# Patient Record
Sex: Male | Born: 1985 | Race: White | Hispanic: No | Marital: Married | Smoking: Never smoker
Health system: Southern US, Community
[De-identification: ages and names within clinical notes are randomized; demographics above are authoritative.]

## PROBLEM LIST (undated history)

## (undated) DIAGNOSIS — K529 Noninfective gastroenteritis and colitis, unspecified: Secondary | ICD-10-CM

## (undated) DIAGNOSIS — K635 Polyp of colon: Secondary | ICD-10-CM

## (undated) DIAGNOSIS — G43909 Migraine, unspecified, not intractable, without status migrainosus: Secondary | ICD-10-CM

## (undated) DIAGNOSIS — L442 Lichen striatus: Secondary | ICD-10-CM

## (undated) DIAGNOSIS — E785 Hyperlipidemia, unspecified: Secondary | ICD-10-CM

## (undated) DIAGNOSIS — A6920 Lyme disease, unspecified: Secondary | ICD-10-CM

## (undated) DIAGNOSIS — M7500 Adhesive capsulitis of unspecified shoulder: Secondary | ICD-10-CM

## (undated) DIAGNOSIS — K589 Irritable bowel syndrome without diarrhea: Secondary | ICD-10-CM

## (undated) DIAGNOSIS — I300 Acute nonspecific idiopathic pericarditis: Secondary | ICD-10-CM

## (undated) HISTORY — PX: HX WISDOM TEETH EXTRACTION: SHX21

## (undated) HISTORY — DX: Migraine, unspecified, not intractable, without status migrainosus: G43.909

## (undated) HISTORY — DX: Lyme disease, unspecified: A69.20

## (undated) HISTORY — PX: CYSTOSCOPY: SUR368

## (undated) HISTORY — PX: COLONOSCOPY: SHX174

## (undated) HISTORY — PX: HX HERNIA REPAIR: SHX51

## (undated) HISTORY — DX: Irritable bowel syndrome, unspecified: K58.9

## (undated) HISTORY — DX: Acute nonspecific idiopathic pericarditis: I30.0

## (undated) HISTORY — PX: SIGMOIDOSCOPY FLEXIBLE: WVUENDOPRO113

## (undated) HISTORY — DX: Adhesive capsulitis of unspecified shoulder: M75.00

## (undated) HISTORY — PX: FLEXIBLE SIGMOIDOSCOPY: SHX5431

## (undated) HISTORY — DX: Lichen striatus: L44.2

## (undated) HISTORY — PX: COLONOSCOPY W/ POLYPECTOMY: SHX1380

## (undated) HISTORY — PX: HERNIA REPAIR: SHX51

## (undated) HISTORY — DX: Noninfective gastroenteritis and colitis, unspecified: K52.9

## (undated) HISTORY — DX: Polyp of colon: K63.5

## (undated) HISTORY — DX: Hyperlipidemia, unspecified: E78.5

---

## 2016-10-04 ENCOUNTER — Other Ambulatory Visit (FREE_STANDING_LABORATORY_FACILITY): Payer: Self-pay | Admitting: PREVENTIVE MEDICINE

## 2016-10-04 ENCOUNTER — Other Ambulatory Visit (INDEPENDENT_AMBULATORY_CARE_PROVIDER_SITE_OTHER): Payer: Self-pay

## 2016-10-04 ENCOUNTER — Ambulatory Visit (INDEPENDENT_AMBULATORY_CARE_PROVIDER_SITE_OTHER): Payer: Self-pay

## 2016-10-04 ENCOUNTER — Other Ambulatory Visit (INDEPENDENT_AMBULATORY_CARE_PROVIDER_SITE_OTHER): Payer: Self-pay | Admitting: PREVENTIVE MEDICINE

## 2016-10-04 ENCOUNTER — Ambulatory Visit (INDEPENDENT_AMBULATORY_CARE_PROVIDER_SITE_OTHER): Payer: Self-pay | Admitting: PREVENTIVE MEDICINE

## 2016-10-04 DIAGNOSIS — Z0189 Encounter for other specified special examinations: Secondary | ICD-10-CM

## 2016-10-04 DIAGNOSIS — Z0000 Encounter for general adult medical examination without abnormal findings: Secondary | ICD-10-CM

## 2016-10-04 LAB — GOLD TOP TUBE

## 2016-10-04 NOTE — Progress Notes (Signed)
Patient here for work-related exam.  See documentation in paper chart.    Cody Conrad 10/04/2016, 13:12

## 2016-10-04 NOTE — Progress Notes (Signed)
Patient here for work-related exam.  See documentation in paper chart.    Cody Pop, DO 10/04/2016, 10:13

## 2016-10-06 ENCOUNTER — Ambulatory Visit (INDEPENDENT_AMBULATORY_CARE_PROVIDER_SITE_OTHER): Payer: Self-pay

## 2016-10-06 DIAGNOSIS — Z0000 Encounter for general adult medical examination without abnormal findings: Secondary | ICD-10-CM

## 2016-10-06 NOTE — Progress Notes (Signed)
Patient here for work-related exam.  See documentation in paper chart.    Revonda Humphrey, RN 10/06/2016, 07:58

## 2016-10-07 LAB — MYCOBACTERIUM TUBERCULOSIS INFECTION DETERMINATION BY QUANTIFERON-TB G
MITOGEN MINUS NIL RESULT: 14.16 IU/mL
NIL RESULT: 0.01 IU/mL
QUANTIFERON, QUALITATIVE: NEGATIVE
TB AG MINUS NIL RESULT: 0 [IU]/mL

## 2016-10-14 ENCOUNTER — Ambulatory Visit
Payer: BC Managed Care – PPO | Attending: Internal Medicine | Admitting: Student in an Organized Health Care Education/Training Program

## 2016-10-14 ENCOUNTER — Encounter (INDEPENDENT_AMBULATORY_CARE_PROVIDER_SITE_OTHER): Payer: Self-pay | Admitting: Student in an Organized Health Care Education/Training Program

## 2016-10-14 VITALS — BP 122/78 | HR 106 | Temp 97.5°F | Ht 69.02 in | Wt 250.2 lb

## 2016-10-14 DIAGNOSIS — K529 Noninfective gastroenteritis and colitis, unspecified: Secondary | ICD-10-CM | POA: Insufficient documentation

## 2016-10-14 DIAGNOSIS — Z6836 Body mass index (BMI) 36.0-36.9, adult: Secondary | ICD-10-CM

## 2016-10-14 DIAGNOSIS — R197 Diarrhea, unspecified: Secondary | ICD-10-CM

## 2016-10-14 DIAGNOSIS — Z23 Encounter for immunization: Secondary | ICD-10-CM | POA: Insufficient documentation

## 2016-10-14 NOTE — H&P (Signed)
[facility]  Operated by [place] Patient History and Physical       Date:   10/14/2016  Name: Cody Conrad  Age: 30 y.o.    Chief Complaint: Establish Care and Diarrhea    History of Present Illness  Patient here to establish care.  Reports 2-3 month history of diarrhea.  Occurs once weekly and lasts for about 1 day.  Described as watery and not bloody.  Does have some abdominal discomfort.  No known triggers and no change in diet or travel history. Drinks purified water.  Endorses doing bland diet which improves but doesn't resolve symptoms.  Imodium also helps.  Does endorse having new pets around.    Patient describes past history of tentative diagnoses of IBS and Ulcerative colitis.  In middle school, he had various stool tests including parasites and upper and lower scopes.  Was initially told he had UC and placed on prednisone.  One year later, had another scope which looked better and was told he had IBS.  His symptoms eventually resolved.  States he does get diarrhea flare ups when dealing with stress, but his current symptoms are worse and longer lasting.  He does have a cousin with Crohn's disease.    Denies rash, fever, chills or other systemic symptoms.    Past Medical History  Past Medical History:   Diagnosis Date    Migraines         PSH:  -hernia repair         Meds:  Takes no medications except occasional imodium    No Known Allergies    Family History  Family Medical History     Problem Relation (Age of Onset)    Alzheimer's/Dementia Paternal Grandmother    Breast Cancer Maternal Grandmother    Congestive Heart Failure Paternal Grandfather    Hepatitis C Father    Parkinsons Disease Paternal Grandfather    Thyroid Disease Mother        Social History  Social History   Substance Use Topics    Smoking status: Never Smoker    Smokeless tobacco: Never Used    Alcohol use Yes      Comment: drink per week      Review of Systems  General: Negative for fevers, chills or weight  loss   Eyes:  Negative for change in vision   HENT: Negative for hearing loss, nasal congestion, epistaxis, sore mouth,  sore throat   Cardiovascular: Negative for chest pain, palpitations, lower extremity edema   Respiratory: Negative for cough, dyspnea, wheezing   GI: Negative for nausea, vomiting, Positive for abdominal pain, diarrhea,    GU: Negative for hematuria, dysuria   Musculoskeletal: Negative for myalgias, arthralgias   Skin: Negative for rashes, lesions   Neurological: Negative for lightheadedness, headaches, dizziness, paresthesias, weakness   Hematological: Negative for easy bruising or bleeding   Psych:  Negative for depression, anxiety       Examination:  BP 122/78   Pulse (!) 106   Temp 36.4 C (97.5 F) (Thermal Scan)    Ht 1.753 m (5' 9.02")   Wt 113.5 kg (250 lb 3.6 oz)   SpO2 98%   BMI 36.93 kg/m2  Physical Exam:  General - awake and alert. No acute distress.  Eyes - no scleral icterus, PERRL  HEENT - moist mucus membranes.  No thyromegaly or thyroid nodules palpated  Respiratory - chest expansion symmetric. Breath sounds clear and equal bilaterally throughout all fields.  No adventitious breath sounds  Cardiac - normal rate and rhythm. S1, S2 present. No murmurs. Normal peripheral perfusion in all extremities. No edema  Abdomen - soft to palpation, non-tender. Bowel sounds present and normal in tone  Extremities - no edema. Extremities intact. No deformity  Skin - warm, dry, and intact  Neurological - appearance, behavior, and speech appropriate. Alert and oriented x 3. No tremors.  Psychological - appears calm during visit      Data reviewed:  I have reviewed recent data/labs.    Assessment and Plan  1. Diarrhea, unspecified type    30 yo male w/PMH of possible UC vs IBS presents to establish care and complaint of chronic diarrhea.    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 Any significant discussion points are noted.    Sabino Gasser, MD 10/14/2016, 11:11

## 2016-10-14 NOTE — Nursing Note (Signed)
Immunization administered     Name Date Dose VIS Date Route    INFLUENZA VACCINE IM 10/14/2016 0.5 mL 07/11/2014 Intramuscular    Site: Left deltoid    Given By: Thornell Mule, [place]    Manufacturer: [employer]    LotUT:[id number]    [place][id number]        [place], [place]  10/14/2016, 10:56

## 2016-11-02 ENCOUNTER — Ambulatory Visit (INDEPENDENT_AMBULATORY_CARE_PROVIDER_SITE_OTHER): Payer: Self-pay | Admitting: Student in an Organized Health Care Education/Training Program

## 2016-11-02 NOTE — Telephone Encounter (Signed)
Regarding: stool sample kit issue and issue with the post office  ----- Message from [place] sent at 11/01/2016  4:12 PM EST -----  Suzanna Obey, MD    Pt called needing to speak with you about an at home stool sample kit you gave them to do after they saw you on 10/14/16. Stated they completed the kit and mailed it back to you, but received the kit back 14 days after it was sent from the post office. Stated they had a note stating postage is due and pt put 10 stamps on before they sent it. Pt is needing to know what you are wanting them to do or if you want them to re-do the kit. Please call them back to advise, thank you.

## 2016-11-03 ENCOUNTER — Ambulatory Visit: Payer: BC Managed Care – PPO | Attending: Nurse Practitioner | Admitting: Nurse Practitioner

## 2016-11-03 ENCOUNTER — Encounter (INDEPENDENT_AMBULATORY_CARE_PROVIDER_SITE_OTHER): Payer: Self-pay | Admitting: Nurse Practitioner

## 2016-11-03 ENCOUNTER — Other Ambulatory Visit (INDEPENDENT_AMBULATORY_CARE_PROVIDER_SITE_OTHER): Payer: Self-pay | Admitting: Student in an Organized Health Care Education/Training Program

## 2016-11-03 ENCOUNTER — Ambulatory Visit (INDEPENDENT_AMBULATORY_CARE_PROVIDER_SITE_OTHER): Payer: BC Managed Care – PPO | Admitting: Rheumatology

## 2016-11-03 DIAGNOSIS — Z6835 Body mass index (BMI) 35.0-35.9, adult: Secondary | ICD-10-CM

## 2016-11-03 DIAGNOSIS — R197 Diarrhea, unspecified: Secondary | ICD-10-CM

## 2016-11-03 DIAGNOSIS — Z7289 Other problems related to lifestyle: Secondary | ICD-10-CM | POA: Insufficient documentation

## 2016-11-03 LAB — COMPREHENSIVE METABOLIC PANEL, NON-FASTING
ALBUMIN: 4.1 g/dL (ref 3.5–5.0)
ALKALINE PHOSPHATASE: 62 U/L (ref <150)
ALT (SGPT): 24 U/L (ref <55)
ANION GAP: 10 mmol/L (ref 4–13)
AST (SGOT): 20 U/L (ref 8–48)
BILIRUBIN TOTAL: 1.1 mg/dL (ref 0.3–1.3)
BUN/CREA RATIO: 14 (ref 6–22)
BUN: 15 mg/dL (ref 8–25)
CALCIUM: 10.1 mg/dL (ref 8.5–10.2)
CHLORIDE: 105 mmol/L (ref 96–111)
CO2 TOTAL: 26 mmol/L (ref 22–32)
CREATININE: 1.04 mg/dL (ref 0.62–1.27)
ESTIMATED GFR: >59 mL/min/1.73mˆ2 (ref >59)
GLUCOSE: 96 mg/dL (ref 65–139)
POTASSIUM: 4.1 mmol/L (ref 3.5–5.1)
POTASSIUM: 4.1 mmol/L (ref 3.5–5.1)
PROTEIN TOTAL: 7.6 g/dL (ref 6.4–8.3)
SODIUM: 141 mmol/L (ref 136–145)

## 2016-11-03 LAB — THYROID STIMULATING HORMONE (SENSITIVE TSH): TSH: 2.145 u[IU]/mL (ref 0.350–5.000)

## 2016-11-03 LAB — CBC
HCT: 47.2 % — ABNORMAL HIGH (ref 36.7–47.0)
MCH: 29.9 pg (ref 27.4–33.0)
MCHC: 35.2 g/dL (ref 32.5–35.8)
MCHC: 35.2 g/dL (ref 32.5–35.8)
MCV: 84.8 fL (ref 78.0–100.0)
MPV: 8.7 fL (ref 7.5–11.5)
PLATELETS: 251 10*3/uL (ref 140–450)
PLATELETS: 251 x10ˆ3/uL (ref 140–450)
RBC: 5.57 x10ˆ6/uL (ref 4.06–5.63)
WBC: 8.3 x10ˆ3/uL (ref 3.5–11.0)

## 2016-11-03 LAB — C-REACTIVE PROTEIN(CRP),INFLAMMATION: CRP INFLAMMATION: <1 mg/L (ref <=8.0)

## 2016-11-03 NOTE — Progress Notes (Signed)
Requesting Physician: Sabino Gasser, MD   CC: Diarrhea  History of Present Illness  Cody Conrad is a 30 y.o. male with PMH of migraines, IBS. NPV to digestive diseases for evaluation of Diarrhea  Life long issues with diarrhea; particularly bad around 2000 in [place]. He describes abdominal pain, frequency and urgency, with occasional blood on what sounds like hemoccult testing.  He had a colonoscopy and was told that he had ulcerative colitis. He was on a year of daily prednisone as well as a 5-ASA - his symptoms then worsened and he underwent a second colonoscopy at that time and was told there was no evidence of IBD and that he actually just has irritable bowel syndrome. At that time he quit seeing his GI provider and managed his diarrhea with diet and occasional Imodium.  Over the last 10 or so years he describes ~4 loose to formed bowel movements a day. At times he would have periods of urgency and increased associated with stress, greasy foods, etc but this would only last for a couple of days.  He recently moved to [place] for post doctorate study (~6 mos ago) and noticed a change in his bowels.   He is now having about 7 or 8 liquid, urgent bowel movements most days- worsening in the last 4 months. At first he attributed it to stress, but feels that this is out of character for him. He will occasionally take an Imodium, which he said does reduce the frequency. He also describes ~3 episodes where he will go 2 days without a bowel movement at all and then he will feel uncomfortable and have a harder stool.   He denies any hematochezia, mucus, steatorhea, nocturnal bowel movements, wt loss. He denies recent ABX use, fever, weight loss, sick contacts. Drinks city water. He does work in a microbiology lab; does not directly handle c diff toxin but has "been around it." Has been eating a bland diet with very minimal help in his diarrhea.  He attempted to complete stool studies ordered by PCP, but had a mix up  with how to submit them and intends to complete them ASAP.      Family history positive for a cousin with crohn's and both maternal GGM and GGF with colon cancer. Family history negative for ulcerative colitis, celiac's, lactose intolerance, IBS.     Past History  No current outpatient prescriptions on file.     No Known Allergies  Past Medical History:   Diagnosis Date    Migraines      Past Surgical History:   Procedure Laterality Date    HX HERNIA REPAIR       Family History  Family Medical History     Problem Relation (Age of Onset)    Alzheimer's/Dementia Paternal Grandmother    Breast Cancer Maternal Grandmother    Colon Cancer Multiple family members    Congestive Heart Failure Paternal Grandfather    Crohn's Disease Other    Hepatitis C Father    Parkinsons Disease Paternal Grandfather    Thyroid Disease Mother        Social History  Social History     Social History    Marital status: Single     Spouse name: N/A    Number of children: N/A    Years of education: N/A     Social History Main Topics    Smoking status: Never Smoker    Smokeless tobacco: Never Used    Alcohol use Yes  Comment: drink per week    Drug use: Not on file    Sexual activity: Not on file     Other Topics Concern    Not on file     Social History Narrative     Review of Systems  General: Denies weight loss, fatigue, or fevers/chills   Eyes: Denies visual disturbances, sensitivity to light, or recent eye infections.  HENT: Denies dysphagia, odynophagia, headaches, or lymphadenopathy    Resp: Denies feelings of shortness of breath. Denies cough and hemoptysis.  Cardiac: Denies chest pain, palpitations, or swelling of extremities  GI: Denies abdominal pain, bloating, heartburn/indigestion, nausea/vomiting, constipation. +diarrhea  Skin: No rashes, jaundice, pruritus, or bruising  Psych: No depression, anxiety, or difficulty coping    Examination  Vitals:   Vitals:    11/03/16 1517   BP: 125/80   Pulse: 67   Temp: 36.6 C  (97.9 F)   TempSrc: Thermal Scan   SpO2: 97%   Weight: 110.3 kg (243 lb 2.7 oz)   Height: 1.774 m (5' 9.84")      General: 30 y.o. well appearing male in no observable distress.    Neuro: Alert, awake, oriented X 3.    Eyes: Sclera non-icteric. Conjunctiva pink.   HENT: Normocephalic, atraumatic   Resp: A/P clear to auscultation. Non-labored on room air.    Cardiac: Regular rate and rhythm. No murmur, gallop, click, or rub. Without edema   GI: Soft, non-distended, non-tender.  Normo-active bowel sounds x4.    Skin: Uniformly pink. Negative for rash, jaundice, or bruising.     Psych: Alert and pleasant.  Stable mood.        Labs    CBC  Diff   No results found for: WBC, WBCJ, HGB, HCT, PLTCNT, SEDRATE, RBC, MCV, MCHC, MCH, RDW, MPV No results found for: PMNS, LYMPHOCYTES, EOSINOPHIL, MONOCYTES, BASOPHILS, PMNABS, LYMPHSABS, EOSABS, MONOSABS, BASOSABS     Comprehensive Metabolic Profile    No results found for: SODIUM, POTASSIUM, CHLORIDE, CO2, ANIONGAP, BUN, CREATININE, GLUCOSE No results found for: CALCIUM, PHOSPHORUS, ALBUMIN, TOTALPROTEIN, ALKPHOS, AST, ALT, TOTBILIRUBIN     Impression  1. Diarrhea, unspecified type      Assessment / Recommendations    Very nice 30 year old male with life long diarrhea that was at one point dx as ulcerative colitis and then told that "a mistake had been made" and it was actually irritable bowel. Has managed his symptoms fairly well with lifestyle, stress reduction, OTC measures until recently when the diarrhea increased in frequency and consistency.     1. Diarrhea   - Differential includes IBS-D, IBD, endocrine imbalance, malabsorption syndromes (lactose intolerance, celiac's, pancreatic insufficiency), infection.  - Questionable remote hx of IBD? ESR and CRP today. Agreeable to schedule colonoscopy for further evaluation. 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   - TSH to rule out endocrine origin of diarrhea  - TTG IgA and  total IgA today for evaluation of celiac's  - CBC, c-diff, giardia, ova/parasites, and stool culture today to rule out infection. He does work in a microbiology setting.   - After infection is ruled out can recommend starting fiber supplement (Metamucil, Benefiber, Citrucel) 1 tbsp every other day for 1 week then increase to daily.   - Advised to consume at least 60 oz of fluid to prevent fiber from causing constipation.  - If daily dosing of fiber is insufficient can increase further to  BID/TID dosing.  - Patient identifies a sensitivity to lactose and typically uses lactose free products at this time. He has never tried a probiotic and we discussed the addition of this could be helpful.   - Advised to go to Emergency Room for any fever or abdominal swelling.       Orders     Orders Placed This Encounter    C Difficile Toxin    Cbc    TISSUE TRANSGLUTAMINASE (TTG) ANTIBODY, IGA, SERUM    Immunoglobulin A    C-Reactive Protein (Inflammation)    Comprehensive Metabolic Panel, Nf    Tsh     This was an independent visit, with cosigning physician present in clinic.       Return to clinic in 6 months.  I encouraged for patient to contact me via phone or [place] for questions between appointments.      Sheilah Mins, APRN, NP-C  Section of Digestive Diseases    The nurse practitioner saw this patient by herself and did not need assistance.

## 2016-11-04 ENCOUNTER — Encounter (INDEPENDENT_AMBULATORY_CARE_PROVIDER_SITE_OTHER): Payer: Self-pay | Admitting: Nurse Practitioner

## 2016-11-04 LAB — IMMUNOGLOBULIN A (IGA), SERUM
IMMUNOGLOBULIN A (IGA): 231 mg/dL (ref 85–499)
IMMUNOGLOBULIN A (IGA): 231 mg/dL (ref 85–499)

## 2016-11-07 ENCOUNTER — Ambulatory Visit: Payer: BC Managed Care – PPO | Attending: Internal Medicine | Admitting: Rheumatology

## 2016-11-07 DIAGNOSIS — R197 Diarrhea, unspecified: Principal | ICD-10-CM | POA: Insufficient documentation

## 2016-11-07 LAB — TISSUE TRANSGLUTAMINASE (TTG) ANTIBODY, IGA, SERUM: TISSUE TRANSGLUTAMINASE ANTIBODIES IGA QUALITATIVE: NEGATIVE

## 2016-11-08 LAB — LACTOFERRIN: LACTOFERRIN: NEGATIVE

## 2016-11-08 LAB — FAT, FECES, QUALITATIVE: TOTAL WEIGHT: 15 g

## 2016-11-08 LAB — OCCULT BLOOD, STOOL: OCCULT BLOOD: NEGATIVE

## 2016-11-08 LAB — E. COLI SHIGA TOXIN: SHIGA TOXIN 2: NEGATIVE

## 2016-11-09 LAB — ROUTINE STOOL CULTURE (INCLUDING E. COLI SHIGA TOXIN): STOOL CULTURE: NORMAL

## 2016-11-10 ENCOUNTER — Encounter (INDEPENDENT_AMBULATORY_CARE_PROVIDER_SITE_OTHER): Payer: Self-pay | Admitting: Nurse Practitioner

## 2016-11-10 LAB — CALPROTECTIN, FECES: CALPROTECTIN, FECES: <15.6 mcg/g

## 2016-12-02 ENCOUNTER — Ambulatory Visit
Admission: RE | Admit: 2016-12-02 | Discharge: 2016-12-02 | Disposition: A | Discharge status: Home / Self Care | Payer: BC Managed Care – PPO | Source: Ambulatory Visit

## 2016-12-02 ENCOUNTER — Encounter (HOSPITAL_COMMUNITY): Payer: Self-pay

## 2016-12-12 ENCOUNTER — Encounter (INDEPENDENT_AMBULATORY_CARE_PROVIDER_SITE_OTHER): Payer: Self-pay | Admitting: Student in an Organized Health Care Education/Training Program

## 2016-12-13 ENCOUNTER — Other Ambulatory Visit (HOSPITAL_COMMUNITY): Payer: Self-pay | Admitting: Gastroenterology

## 2016-12-13 ENCOUNTER — Ambulatory Visit (HOSPITAL_COMMUNITY): Payer: BC Managed Care – PPO | Admitting: ANESTHESIOLOGY

## 2016-12-13 ENCOUNTER — Encounter (HOSPITAL_COMMUNITY): Payer: Self-pay

## 2016-12-13 ENCOUNTER — Inpatient Hospital Stay
Admission: RE | Admit: 2016-12-13 | Discharge: 2016-12-13 | Disposition: A | Discharge status: Home / Self Care | Payer: BC Managed Care – PPO | Source: Ambulatory Visit | Attending: Gastroenterology | Admitting: Gastroenterology

## 2016-12-13 ENCOUNTER — Ambulatory Visit (HOSPITAL_BASED_OUTPATIENT_CLINIC_OR_DEPARTMENT_OTHER): Payer: BC Managed Care – PPO | Admitting: ANESTHESIOLOGY

## 2016-12-13 ENCOUNTER — Encounter (HOSPITAL_COMMUNITY)
Admission: RE | Disposition: A | Discharge status: Home / Self Care | Payer: Self-pay | Source: Ambulatory Visit | Attending: Gastroenterology

## 2016-12-13 ENCOUNTER — Ambulatory Visit (HOSPITAL_BASED_OUTPATIENT_CLINIC_OR_DEPARTMENT_OTHER): Payer: BC Managed Care – PPO | Admitting: Gastroenterology

## 2016-12-13 DIAGNOSIS — R194 Change in bowel habit: Secondary | ICD-10-CM

## 2016-12-13 DIAGNOSIS — D125 Benign neoplasm of sigmoid colon: Secondary | ICD-10-CM

## 2016-12-13 SURGERY — COLONOSCOPY
Anesthesia: Monitor Anesthesia Care | Site: Anus | Wound class: Clean Contaminated Wounds-The respiratory, GI, Genital, or urinary

## 2016-12-13 MED ORDER — LACTATED RINGERS INTRAVENOUS SOLUTION
INTRAVENOUS | Status: DC
Start: 2016-12-13 — End: 2016-12-13

## 2016-12-13 MED ORDER — FENTANYL (PF) 50 MCG/ML INJECTION SOLUTION
Freq: Once | INTRAMUSCULAR | Status: DC | PRN
Start: 2016-12-13 — End: 2016-12-13

## 2016-12-13 MED ORDER — SODIUM CHLORIDE 0.9 % (FLUSH) INJECTION SYRINGE
2.0000 mL | INJECTION | Freq: Three times a day (TID) | INTRAMUSCULAR | Status: DC
Start: 2016-12-13 — End: 2016-12-13

## 2016-12-13 MED ORDER — SODIUM CHLORIDE 0.9 % (FLUSH) INJECTION SYRINGE
2.0000 mL | INJECTION | INTRAMUSCULAR | Status: DC | PRN
Start: 2016-12-13 — End: 2016-12-13

## 2016-12-13 MED ORDER — PROPOFOL 10 MG/ML IV BOLUS
INJECTION | Freq: Once | INTRAVENOUS | Status: DC | PRN
Start: 2016-12-13 — End: 2016-12-13
  Administered 2016-12-13 (×3): 100 mg via INTRAVENOUS
  Administered 2016-12-13: 50 mg via INTRAVENOUS

## 2016-12-13 MED ORDER — PROPOFOL 10 MG/ML INTRAVENOUS EMULSION
INTRAVENOUS | Status: DC | PRN
Start: 2016-12-13 — End: 2016-12-13
  Administered 2016-12-13: 0 ug/kg/min via INTRAVENOUS
  Administered 2016-12-13: 250 ug/kg/min via INTRAVENOUS

## 2016-12-13 MED ORDER — MIDAZOLAM 1 MG/ML INJECTION SOLUTION
Freq: Once | INTRAMUSCULAR | Status: DC | PRN
Start: 2016-12-13 — End: 2016-12-13
  Administered 2016-12-13: 2 mg via INTRAVENOUS

## 2016-12-13 MED ADMIN — ONDANSETRON/ DEXAMETHASONE IVPB: INTRAVENOUS | @ 14:00:00

## 2016-12-13 MED ADMIN — lactated Ringers intravenous solution: INTRAVENOUS | @ 14:00:00

## 2016-12-13 SURGICAL SUPPLY — 70 items
CATH CRE 10-11-12MM 7.5FR 5.5CM 240CM 2.8MM 3.2MM LOW PROF GW BAL DIL ESOPH PYL BIL PEBAX STRL LF (BALLOON) IMPLANT
CATH ELHMST GLD PRB 10FR 300CM_BIPO RND DIST TIP STD CONN (DIAGNOSTIC)
CATH ELHMST GLD PROBE 10FR 300CM BIPOLAR RND DIST TIP STD CONN FIRM SHAFT HMGLD STRL DISP 3.7MM MN (DIAGNOSTIC) IMPLANT
CATH ELHMST GLD PROBE 7FR 300CM BIPOLAR RND DIST TIP STD CONN FIRM SHAFT HMGLD STRL DISP 2.8MM MN (DIAGNOSTIC) IMPLANT
CATH ELHMST GLD PROBE 7FR 300C_M BIPOLAR RND DIST TIP STD (DIAGNOSTIC)
CLIP HMST MR CONDITIONAL BRD CATH ROT CONTROL KNOB NO SHEATH RSL 360 235CM 2.8MM 11MM OPN (SURGICAL INSTRUMENTS) IMPLANT
CONV USE ITEM 343591 - SOLIDIFY FLUID 1500ML DSPNSR L_Q TX SOLIDIFY SFTP LTS+ DISP (STER) ×1 IMPLANT
DEVICE HEMOSTASIS CLIP 360_235CM RESOLUTION (INSTRUMENTS)
DILATOR ENDOS CRE 240CM 5.5CM 11-13.5-15MM 7.5FR ESOPH PYL BIL BAL LOW PROF GW PEBAX STRL LF  DISP (BALLOON) IMPLANT
DILATOR ENDOS CRE 240CM 5.5CM 15-16.5-18MM 7.5FR ESOPH PYL BIL BAL LOW PROF GW PEBAX STRL LF  DISP (BALLOON) IMPLANT
DILATOR ENDOS CRE 240CM 5.5CM_11-13.5-15MM 7.5FR ESOPH PYL (BALLOON)
DILATOR ENDOS CRE 240CM 5.5CM_15-16.5-18MM 7.5FR ESOPH PYL (BALLOON)
DILATOR HERCULES 10-11-12_M00558680 EA (BALLOON)
DISC USE ITEM 309153 - TRAP SPECI ARGYLE 40CC GRAD SC_REW ON CAP REM MALE CONN (Cautery Accessories)
DISCONTINUED NO SUB - JELLY LUB DYNALUBE BCTRST WATER SOL NGRS PKT STRL 5GM LF (WOUND CARE SUPPLY) ×2 IMPLANT
DISCONTINUED USE ITEM 309153 - TRAP SPECI ARGYLE 40CC GRAD SC_REW ON CAP REM MALE CONN (Cautery Accessories) IMPLANT
DISCONTINUED USE ITEM 339015 - CONTAINR STRL 10% NEUT BF FRMLN POLYPROP GRAD LEAK RST ORNG PREFL SCREW CAP FSHR HLTHCR PRTCL GRN (CHEM) ×1 IMPLANT
DISCONTINUED USE ITEM 82101 - TUBING OXYGEN 50/CS 001302 (TUBE/TUBING & SUCTION SUPPLIES) IMPLANT
ELECTRODE PATIENT RTN 9FT VLAB C30- LB RM PHSV ACRL FOAM CORD NONIRRITATE NONSENSITIZE ADH STRP (CAUTERY SUPPLIES) IMPLANT
ELECTRODE PATIENT RTN 9FT VLAB_REM C30- LB PLHSV ACRL FOAM (CAUTERY SUPPLIES)
FORCEPS BIOPSY 240CM 2.4MM RJ_4 2.8MM LRG CPC DISP ORNG (GUIDING)
FORCEPS BIOPSY MICROMESH TTH STREAMLINE CATH 240CM 2.4MM RJ 4 SS LRG CPC STRL DISP ORNG 2.8MM WRK (GUIDING) IMPLANT
FORCEPS BIOPSY NEEDLE 160CM 1.8MM RJ 4 DISP YW 2MM WRK CHNL GSPED (SURGICAL INSTRUMENTS) IMPLANT
FORCEPS BIOPSY NEEDLE 160CM 1._8MM RJ 4 PED 2+ MM DISP (INSTRUMENTS)
FORCEPS BIOPSY NEEDLE 240CM 2.2MM RJ 4 2.8MM STD CPC STRL DISP ORNG (SURGICAL INSTRUMENTS) ×1 IMPLANT
FORCEPS BIOPSY NEEDLE 240CM 2._2MM RJ 4 2.8MM STD CPC STRL (INSTRUMENTS) ×2
FORCEPS ENDOS 240CM 2.8MM RJ 4 JMB DISP (SURGICAL INSTRUMENTS) IMPLANT
FORCEPS ENDOS 240CM 2.8MM RJ 4_JMB DISP (INSTRUMENTS)
FORMALIN 10% BUFF 8ML_23032059 100EA/PK (CHEM) ×1
GOWN SURG XL AAMI L3 NONREINFO_RCE HKLP CLSR STRL LTX PNK SMS (DGOW)
GOWN SURG XL L3 NONREINFORCE HKLP CLSR STRL LTX PNK SMS 47IN (DGOW) IMPLANT
INK ENDOSCOPIC MARKER SPOT 5ML GIS44 STERILE 10EA/BX (MISCELLANEOUS PT CARE ITEMS) IMPLANT
JELLY LUB EZ BCTRST H2O SOL NG_RS PKT STRL 5GM LF (WOUND CARE/ENTEROSTOMAL SUPPLY) ×2
LIGATOR 2300MM 30MM PLLP PRELD_DEV INT HNDL DISP ENDOS (INSTRUMENTS ENDOMECHANICAL)
LIGATOR DEV STRL DISP ENDOS LF (ENDOSCOPIC SUPPLIES) IMPLANT
LINER SUCT RD CRD MEDIVAC TW LOCK LID SHTOF VALVE CAN FILTER 1500CC LF  DISP (Suction) ×1 IMPLANT
LINER SUCT RD CRD MEDIVAC TW L_OCK LID SHTOF VALVE CAN FLTR (Suction) ×1
LOOP AUTOCLAV HLDR DTCH 30MM S_URG NYL PLPCTM NONST DISP (INSTRUMENTS ENDOMECHANICAL)
LOOP HLDR DTCH AUTOCLAV 30MM SURG NYL PLPCTM NONST LF  DISP (ENDOSCOPIC SUPPLIES) IMPLANT
NEEDLE ENDOS 5MM 25GA 2.5MM SS TFLN 230CM INJ PRJ SHEATH LL SPRG LD HNDL CRLK STRL LF  DISP (NEEDLES & SYRINGE SUPPLIES) IMPLANT
NEEDLE ENDOS 5MM 25GA 2.5MM SS_TFLN 230CM INJ PRJ SHTH LL (NEEDLES & SYRINGE SUPPLIES)
NEEDLE SCLRTX 25GA 2.3MM OPTC TIP SHTH STRL LF DISP YW (NEEDLES & SYRINGE SUPPLIES) IMPLANT
NET SPEC RETR 160CM 3MM RTHNT MAXI SHEATH 8X4CM NONST LF  DISP (Dilators) IMPLANT
NET SPEC RETR 160MM 1.8MM RTHN_T MINI 4.5X2CM PED NONST LF (Dilators)
NET SPEC RETR 230CM 2.5MM RTHNT STD SHEATH 6X3CM NONST LF  DISP (Dilators) IMPLANT
NET SPEC RETR 230CM 2.5MM RTHN_T STD SHTH 6X3CM NONST LF (Dilators)
NET SPEC RETR 8X4CM 3MM RTHNT_MAXI 160CM NONST LF DISP (Dilators)
PROBE COAG 7.2FT 6.9FR FIAPC CRCMF PLUG PLAY FUNCTIONALITY FILTER (ENDOSCOPIC SUPPLIES) IMPLANT
PROBE ESURG 220CM 2.3MM FIAPC FLXB STR FIRE STRL DISP (CAUTERY SUPPLIES) IMPLANT
PROBE ESURG 220CM 2.3MM FIAPC_FLXB STR FIRE ARGON PLAS COAG (CAUTERY SUPPLIES)
PROBE ESURG 7.2FT 6.9FR FIAPC_CRCMF PLUG PLAY FUNCTIONALITY (INSTRUMENTS ENDOMECHANICAL)
RETRIEVER ENDOS 160CM 1.8MM RTHNT MINI SM CATH SHEATH 4.5X2CM NONST (Dilators) IMPLANT
SNARE 9MM 230CM 2.4MM EXACTO COLD BRD WRE CLEAN CUT ENDOS PLC RESCT STRL LF  DISP (ENDOSCOPIC SUPPLIES) ×1 IMPLANT
SNARE 9MM 230CM 2.4MM EXACTO C_OLD BRD WRE CLEAN CUT ENDOS (INSTRUMENTS ENDOMECHANICAL) ×1
SNARE MED OVAL 240CM 2.4MM SNS LOOP SHRTHRW FLXB ENDOS 2.8MM WRK CHNL PLPCTM 27MM STRL DISP (ENDOSCOPIC SUPPLIES) IMPLANT
SNARE MED OVAL 240CM 2.4MM SNS_LOOP SHRTHRW FLXB ENDOS 2.8MM (INSTRUMENTS ENDOMECHANICAL)
SNARE SM OVAL 240CM 2.4MM SNS LOOP SHRTHRW FLXB ENDOS PLPCTM 13MM STRL LF  DISP (DIAGNOSTIC) IMPLANT
SNARE SM OVL 240CM 2.4MM SNS L_OOP SHRTHRW FLXB ENDOS PLPCTM (DIAGNOSTIC)
SOLIDIFY FLUID 1500ML DSPNSR L_Q TX SOLIDIFY SFTP LTS+ DISP (STER) ×2
SOLUTION IRRG H2O 500CC 2F7113 (SOLUTIONS) ×1
SYRINGE INFLAT ALN II GA STRL DISP 60ML (NEEDLES & SYRINGE SUPPLIES) IMPLANT
SYRINGE INFLAT ALN II GA STRL_DISP 60ML (NEEDLES & SYRINGE SUPPLIES)
SYRINGE LL 50ML LF  STRL GRAD N-PYRG DEHP-FR PVC FREE MED DISP CLR (NEEDLES & SYRINGE SUPPLIES) IMPLANT
SYRINGE LL 50ML LF STRL GRAD_N-PYRG DEHP-FR PVC FREE MED (NEEDLES & SYRINGE SUPPLIES)
TRAP SPEC RETR ETRAP MAGNIFY W IND MSR GUIDE PLYP LF DISP (GI LAB SUPPLIES) ×2 IMPLANT
TUBING OXYGEN 50/CS 001302 (TUBE/TUBING & SUCTION SUPPLIES)
TUBING SUCT CLR 20FT 9/32IN MEDIVAC NCDTV M/M CONN STRL LF (Suction) ×1 IMPLANT
TUBING SUCT CONN 20FT LONG_STRL N720A (Suction) ×1
USE ITEM 43607 SNARE MED OVAL 240CM 2.4MM SNS LOOP SHRTHRW FLXB ENDOS 2.8MM WRK CHNL PLPCTM 27MM STRL DISP (ENDOSCOPIC SUPPLIES) IMPLANT
WATER STRL 500ML PLASTIC PR BTL LF (SOLUTIONS) ×1 IMPLANT

## 2016-12-13 NOTE — Discharge Instructions (Signed)
SURGICAL DISCHARGE INSTRUCTIONS     Dr. Kupec, Justin T., MD  performed your COLONOSCOPY, COLONOSCOPY WITH BIOPSY, COLONOSCOPY WITH POLYPECTOMY today at the [facility]    [facility]:  Monday through Friday from 6 a.m. - 7 p.m.: [phone]  Between 7 p.m. - 6 a.m., weekends and holidays:  Call [facility] at [phone] or [phone].    PLEASE SEE WRITTEN HANDOUTS AS DISCUSSED BY YOUR NURSE:      SIGNS AND SYMPTOMS OF A WOUND / INCISION INFECTION   Be sure to watch for the following:   Increase in redness or red streaks near or around the wound or incision.   Increase in pain that is intense or severe and cannot be relieved by the pain medication that your doctor has given you.   Increase in swelling that cannot be relieved by elevation of a body part, or by applying ice, if permitted.   Increase in drainage, or if yellow / green in color and smells bad. This could be on a dressing or a cast.   Increase in fever for longer than 24 hours, or an increase that is higher than 101 degrees Fahrenheit (normal body temperature is 98 degrees Fahrenheit). The incision may feel warm to the touch.    **CALL YOUR DOCTOR IF ONE OR MORE OF THESE SIGNS / SYMPTOMS SHOULD OCCUR.    ANESTHESIA INFORMATION   ANESTHESIA -- ADULT PATIENTS:  You have received intravenous sedation / general anesthesia, and you may feel drowsy and light-headed for several hours. You may even experience some forgetfulness of the procedure. DO NOT DRIVE A MOTOR VEHICLE or perform any activity requiring complete alertness or coordination until you feel fully awake in about 24-48 hours. Do not drink alcoholic beverages for at least 24 hours. Do not stay alone, you must have a responsible adult available to be with you. You may also experience a dry mouth or nausea for 24 hours. This is a normal side effect and will disappear as the effects of the medication wear off.    REMEMBER   If you experience any difficulty  breathing, chest pain, bleeding that you feel is excessive, persistent nausea or vomiting or for any other concerns:  Call your physician Dr. Kupec at [phone] or [phone]. You may also ask to have the GI doctor on call paged. They are available to you 24 hours a day.    SPECIAL INSTRUCTIONS / COMMENTS       FOLLOW-UP APPOINTMENTS   Please call patient services at [phone] or [phone] to schedule a date / time of return. They are open Monday - Friday from 7:30 am - 5:00 pm.  Instructions for Lower Endoscopy  Patient Information:  No driving until tomorrow, You may experience a small amount of blood in your stool if a biopsy has been taken  and Call in one week for biopsy results - Referring MD   REMEMBER TO:  Call your physician for any of the following symptoms:  1.) Persistent vomiting or vomiting bright red blood  2.) Fever (temperature greater than 100F)  3.) Severe abdominal pain or rigid bloated abdomen.  4.) Large amounts of bright red blood or maroon stools.

## 2016-12-13 NOTE — H&P (Signed)
[facility]  Pre-Anesthesia Brief History and Physical      Cody Conrad, Cody Conrad   MRN:  [id number]  Date of Birth:  [birth year]    Date of Procedure:  12/13/2016    Chief Complaint: Change in bowel habits    HPI: Cody Conrad, Cody Conrad is a 31 y.o. year old male who presents today for Colonoscopy.   This is the patient's 2nd colonoscopy..    This procedure is being done to evaluate Change in bowel habits.  He reports a possible FMHx of Colon Cancer in paternal great grandparent.    The patient denies hematochezia, melena or unintentional weight loss.      Past Medical History:   Diagnosis Date   . Migraines            Allergies   Allergen Reactions   . Nkda [No Known Drug Allergies]        Medications Prior to Admission     Prescriptions    Bifidobacterium infantis (ALIGN) 10.5 mg (10 million cell) Oral Tablet, Chewable    Take by mouth Once a day           Past Surgical History:   Procedure Laterality Date   . COLONOSCOPY     . HX HERNIA REPAIR     . HX WISDOM TEETH EXTRACTION     . SIGMOIDOSCOPY FLEXIBLE             Physical Exam:  VS: BP (!) 135/91  Pulse 76  Temp 36.2 C (97.2 F)  Resp 20  Ht 1.753 m (5\' 9" )  Wt 105.6 kg (232 lb 12.9 oz)  SpO2 99%  BMI 34.38 kg/m2  General: Stable appearing, age-appropriate 31 y.o. male in no acute distress.  Eyes: Sclera not icteric, no erythema.  HENT: Normocephalic/atraumatic, moist mucous membranes.  Lungs: Breathing comfortably, no cough or wheeze.  Abdomen: Soft, nontender, nondistended.  Skin: No jaundice, no rash.  Psych: Alert and oriented, responds appropriately, pleasant.      Assessment:  Change in bowel habits    Plan:  Proceed with Colonoscopy.     Joya San, MD  Fellow, Section of [place]  Pager: [phone]    =====================================================  12/13/2016    I have seen and examined the patient.  I reviewed the fellow's note.  I agree with the findings and plan of care as documented in the fellow's note.  Any  exceptions/additions are edited/noted.    Change in BMs    Sharyn Creamer, MD, [facility]   12/13/2016, 14:02  Assistant Professor of Medicine  Section of [place] / [place]

## 2016-12-13 NOTE — Care Plan (Signed)
Problem: Anxiety (Adult)  Goal: Identify Related Risk Factors and Signs and Symptoms  Related risk factors and signs and symptoms are identified upon initiation of Human Response Clinical Practice Guideline (CPG)   Outcome: Ongoing (see interventions/notes)    Problem: Thought Process Alteration (Adult)  Goal: Identify Related Risk Factors and Signs and Symptoms  Related risk factors and signs and symptoms are identified upon initiation of Human Response Clinical Practice Guideline (CPG)   Outcome: Ongoing (see interventions/notes)    Problem: Perioperative Period (Adult)  Prevent and manage potential problems including:1. bleeding2. gastrointestinal complications3. hypothermia4. infection5. pain6. perioperative injury7. respiratory compromise8. situational response9. urinary retention10. venous thromboembolism11. wound complications   Goal: Signs and Symptoms of Listed Potential Problems Will be Absent or Manageable (Perioperative Period)  Signs and symptoms of listed potential problems will be absent or manageable by discharge/transition of care (reference Perioperative Period (Adult) CPG).   Outcome: Ongoing (see interventions/notes)

## 2016-12-13 NOTE — Anesthesia Transfer of Care (Signed)
ANESTHESIA TRANSFER OF CARE   Cody Conrad is a 31 y.o. ,male, Weight: 105.6 kg (232 lb 12.9 oz)   had Procedure(s):  COLONOSCOPY  COLONOSCOPY WITH BIOPSY  COLONOSCOPY WITH POLYPECTOMY  performed  12/13/16   Primary Service: Sharyn Creamer, MD    Past Medical History:   Diagnosis Date    Migraines       Allergy History as of 12/13/16     NO KNOWN DRUG ALLERGIES       Noted Status Severity Type Reaction    12/02/16 1609 Sherre Lain, RN 12/02/16 Active                 I completed my transfer of care / handoff to the receiving personnel during which we discussed:  Access, Airway, All key/critical aspects of case discussed, Analgesia, Expectation of post procedure and Fluids/Product                                                                    Last OR Temp: Temperature: 36.3 C (97.3 F)  ABG:  POTASSIUM   Date Value Ref Range Status   11/03/2016 4.1 3.5 - 5.1 mmol/L Final     CALCIUM   Date Value Ref Range Status   11/03/2016 10.1 8.5 - 10.2 mg/dL Final     Airway:   Blood pressure 116/74, pulse 78, temperature 36.3 C (97.3 F), resp. rate 20, height 1.753 m (5\' 9" ), weight 105.6 kg (232 lb 12.9 oz), SpO2 99 %.

## 2016-12-13 NOTE — Anesthesia Postprocedure Evaluation (Signed)
Anesthesia Post Op Evaluation    Patient: Cody Conrad  Procedure(s) Performed:Procedure(s):  COLONOSCOPY  COLONOSCOPY WITH BIOPSY  COLONOSCOPY WITH POLYPECTOMY  Last Vitals:Temperature: 36.3 C (97.3 F) (12/13/16 1438)  Heart Rate: 78 (12/13/16 1438)  BP (Non-Invasive): 116/74 (12/13/16 1438)  Respiratory Rate: 20 (12/13/16 1308)  SpO2-1: 99 % (12/13/16 1438)    Patient location during evaluation: PACU       Patient participation: complete - patient participated  Level of consciousness: awake  Pain management: adequate  Airway patency: patent  Anesthetic complications: no  Cardiovascular status: acceptable  Respiratory status: acceptable

## 2016-12-13 NOTE — Care Plan (Signed)
Problem: Perioperative Period (Adult)  Prevent and manage potential problems including:1. bleeding2. gastrointestinal complications3. hypothermia4. infection5. pain6. perioperative injury7. respiratory compromise8. situational response9. urinary retention10. venous thromboembolism11. wound complications   Goal: Signs and Symptoms of Listed Potential Problems Will be Absent or Manageable (Perioperative Period)  Signs and symptoms of listed potential problems will be absent or manageable by discharge/transition of care (reference Perioperative Period (Adult) CPG).   Outcome: Adequate for Discharge Date Met:  12/13/16

## 2016-12-13 NOTE — Anesthesia Preprocedure Evaluation (Signed)
ANESTHESIA PRE-OP EVALUATION  Review of Systems     Physical Assessment      Airway       Mallampati: III    TM distance: >3 FB    Neck ROM: full  Mouth Opening: good.    [place]       Dentition intact             Pulmonary    Breath sounds clear to auscultation       Cardiovascular    Rhythm: regular  Rate: Normal       Other findings            Plan  Planned anesthesia type: MAC    ASA 2     Intravenous induction     Anesthetic plan and risks discussed with patient.     Anesthesia issues/risks discussed are: Dental Injuries, PONV, Stroke, Blood Loss, Intraoperative Awareness/ Recall, Aspiration and Cardiac Events/MI.    Use of blood products discussed with patient whom consented to blood products.     Patient's NPO status is appropriate for Anesthesia.           Plan discussed with CRNA.                 Functional capacity greater than 4 METS  Denies chest pain, no shortness of breath

## 2016-12-14 LAB — HISTORICAL SURGICAL PATHOLOGY SPECIMEN

## 2016-12-15 ENCOUNTER — Encounter (INDEPENDENT_AMBULATORY_CARE_PROVIDER_SITE_OTHER): Payer: Self-pay | Admitting: Gastroenterology

## 2016-12-15 ENCOUNTER — Encounter (INDEPENDENT_AMBULATORY_CARE_PROVIDER_SITE_OTHER): Payer: Self-pay | Admitting: Nurse Practitioner

## 2016-12-17 ENCOUNTER — Encounter (INDEPENDENT_AMBULATORY_CARE_PROVIDER_SITE_OTHER): Payer: Self-pay | Admitting: Student in an Organized Health Care Education/Training Program

## 2016-12-23 ENCOUNTER — Encounter (INDEPENDENT_AMBULATORY_CARE_PROVIDER_SITE_OTHER): Payer: Self-pay | Admitting: Student in an Organized Health Care Education/Training Program

## 2016-12-23 NOTE — Nursing Note (Signed)
Mailed appeal letter to [facility].  Reino Kent, RN  12/23/2016, 09:07

## 2017-05-03 ENCOUNTER — Encounter (INDEPENDENT_AMBULATORY_CARE_PROVIDER_SITE_OTHER): Payer: BC Managed Care – PPO | Admitting: Nurse Practitioner

## 2017-09-01 ENCOUNTER — Encounter: Payer: Self-pay | Admitting: Family Medicine

## 2017-09-01 ENCOUNTER — Ambulatory Visit (INDEPENDENT_AMBULATORY_CARE_PROVIDER_SITE_OTHER): Payer: 59 | Admitting: Family Medicine

## 2017-09-01 VITALS — BP 132/92 | HR 75 | Temp 98.2°F | Ht 69.25 in | Wt 235.6 lb

## 2017-09-01 DIAGNOSIS — K580 Irritable bowel syndrome with diarrhea: Secondary | ICD-10-CM

## 2017-09-01 DIAGNOSIS — R21 Rash and other nonspecific skin eruption: Secondary | ICD-10-CM | POA: Diagnosis not present

## 2017-09-01 DIAGNOSIS — Z7689 Persons encountering health services in other specified circumstances: Secondary | ICD-10-CM

## 2017-09-01 DIAGNOSIS — L442 Lichen striatus: Secondary | ICD-10-CM

## 2017-09-01 MED ORDER — CLOBETASOL PROPIONATE 0.05 % EX OINT: 1.0000 "application " | TOPICAL_OINTMENT | Freq: Two times a day (BID) | CUTANEOUS | 2 refills | Status: DC

## 2017-09-01 NOTE — Assessment & Plan Note (Signed)
Spot treat with clobetasol, but not bothersome whatsoever to pt and seems to come and go.Continue to monitor.

## 2017-09-01 NOTE — Patient Instructions (Signed)
Follow up as needed

## 2017-09-01 NOTE — Progress Notes (Signed)
BP (!) 132/92 (BP Location: Left Arm, Patient Position: Sitting, Cuff Size: Large)   Pulse 75   Temp 98.2 F (36.8 C)   Ht 5' 9.25" (1.759 m)   Wt 235 lb 9.6 oz (106.9 kg)   SpO2 99%   BMI 34.54 kg/m    Subjective:    Patient ID: Antonio Mueller, male    DOB: 1986/09/14, 31 y.o.   MRN: [id number]  HPI: Antonio Mueller is a 31 y.o. male  Chief Complaint  Patient presents with  . Establish Care  . Rash    Left arm. Circular. Been there for 6 months. Doesn't itch, burn and isn't painful.   Pt presents today to establish care.   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 Had another repair in 1st grade. No subsequent issues with hernias.   Started having chronic diarrhea in high school. Cousin with Crohn's dz. Misdiagnosed with UC at that time, underwent tx and did not get any better. Multiple colonoscopies since. Dx'ed with IBS and has learned his triggers, doing a fair bit better now. Had a relapse 1.5 years ago, did a colonoscopy with biopsies at that point. WNL other than 1 benign polyp so it was recommended to do 5 year follow up colonoscopy. Has been using fiber supplements and probiotics which seem to help. Currently feels like sxs are under good control.   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 Comes and goes over the years, did not respond to steroid creams in the past. Now having a circular similar rash on left forearm that does respond to hydrocortisone cream. Does not itch, skin is just shiny right there and gets red from time to time.   Last CPE was many years ago, but has had recent blood work for his job.   Past Medical History:  Diagnosis Date  . Colon polyp   . Inflammatory bowel diseases (IBD)   . Lichen striatus   . Migraine headache    Social History   Social History  . Marital status: Significant Other    Spouse name: N/A  . Number of children: N/A  . Years of education: N/A   Occupational History  . Not on file.   Social  History Main Topics  . Smoking status: Never Smoker  . Smokeless tobacco: Never Used  . Alcohol use Yes     Comment: Occasionally  . Drug use: No  . Sexual activity: Not on file   Other Topics Concern  . Not on file   Social History Narrative  . No narrative on file    Relevant past medical, surgical, family and social history reviewed and updated as indicated. Interim medical history since our last visit reviewed. Allergies and medications reviewed and updated.  Review of Systems  Constitutional: Negative.   HENT: Negative.   Respiratory: Negative.   Cardiovascular: Negative.   Gastrointestinal: Positive for diarrhea.  Genitourinary: Negative.   Musculoskeletal: Negative.   Skin: Positive for rash.  Neurological: Negative.   Psychiatric/Behavioral: Negative.    Per HPI unless specifically indicated above     Objective:    BP (!) 132/92 (BP Location: Left Arm, Patient Position: Sitting, Cuff Size: Large)   Pulse 75   Temp 98.2 F (36.8 C)   Ht 5' 9.25" (1.759 m)   Wt 235 lb 9.6 oz (106.9 kg)   SpO2 99%   BMI 34.54 kg/m   Wt Readings from Last 3 Encounters:  09/01/17 235 lb 9.6 oz (  106.9 kg)    Physical Exam  Constitutional: He is oriented to person, place, and time. He appears well-developed and well-nourished. No distress.  HENT:  Head: Atraumatic.  Eyes: Pupils are equal, round, and reactive to light. Conjunctivae are normal.  Neck: Normal range of motion. Neck supple.  Cardiovascular: Normal rate and normal heart sounds.   Pulmonary/Chest: Effort normal and breath sounds normal. No respiratory distress.  Musculoskeletal: Normal range of motion.  Neurological: He is alert and oriented to person, place, and time.  Skin: Skin is warm and dry. Rash (Circular erythematous area on left forearm) noted.  Psychiatric: He has a normal mood and affect. His behavior is normal.  Nursing note and vitals reviewed.  No results found for this or any previous visit.      Assessment & Plan:   Problem List Items Addressed This Visit      Musculoskeletal and Integument   Lichen striatus    Spot treat with clobetasol, but not bothersome whatsoever to pt and seems to come and go.Continue to monitor.        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 Discussed bentyl if sxs flare again, pt will let me know if this happens       Follow up plan: Return for CPE.

## 2017-12-20 ENCOUNTER — Ambulatory Visit: Payer: Managed Care, Other (non HMO) | Admitting: Family Medicine

## 2017-12-20 ENCOUNTER — Other Ambulatory Visit: Payer: Self-pay

## 2017-12-20 VITALS — BP 125/90 | HR 71 | Temp 97.8°F | Resp 16 | Wt 218.5 lb

## 2017-12-20 DIAGNOSIS — K921 Melena: Secondary | ICD-10-CM

## 2017-12-20 DIAGNOSIS — K529 Noninfective gastroenteritis and colitis, unspecified: Secondary | ICD-10-CM | POA: Diagnosis not present

## 2017-12-20 MED ORDER — PANTOPRAZOLE SODIUM 40 MG PO TBEC: 40.0000 mg | DELAYED_RELEASE_TABLET | Freq: Every day | ORAL | 1 refills | Status: DC

## 2017-12-20 NOTE — Progress Notes (Signed)
BP 125/90 (BP Location: Right Arm, Patient Position: Sitting)   Pulse 71   Temp 97.8 F (36.6 C) (Oral)   Resp 16   Wt 218 lb 8 oz (99.1 kg)   SpO2 98%   BMI 32.03 kg/m    Subjective:    Patient ID: Antonio Mueller, male    DOB: [birth year], 32 y.o.   MRN: [id number]  HPI: Antonio Mueller is a 32 y.o. male  Chief Complaint  Patient presents with  . Abdominal Pain    with blood tinged stool    4-5 days of abdominal discomfort and some dark stools that he thought may be tinged with blood. Does note that he'd had red cabbage last week but made sure not to eat any more after that point. Normal stools as far as timing and consistency. Has eaten lots of almonds the past few days and wondering if that has been irritating his bowels as he's noticed lots of undigested bits in his stools. Denies fevers, chills, N/V. Does have a long GI hx with possible presence of UC but has not had any flares in years. States this feels very different anyway.  Takes digestive advantage and one other probiotics and a fiber supplement daily. Doing keto diet, losing weight at a great pace.    Relevant past medical, surgical, family and social history reviewed and updated as indicated. Interim medical history since our last visit reviewed. Allergies and medications reviewed and updated.  Review of Systems  Constitutional: Negative.   HENT: Negative.   Respiratory: Negative.   Cardiovascular: Negative.   Gastrointestinal: Positive for abdominal pain and blood in stool.  Genitourinary: Negative.   Musculoskeletal: Negative.   Skin: Negative.   Neurological: Negative.   Psychiatric/Behavioral: Negative.    Per HPI unless specifically indicated above     Objective:    BP 125/90 (BP Location: Right Arm, Patient Position: Sitting)   Pulse 71   Temp 97.8 F (36.6 C) (Oral)   Resp 16   Wt 218 lb 8 oz (99.1 kg)   SpO2 98%   BMI 32.03 kg/m   Wt Readings from Last 3 Encounters:  12/20/17 218 lb 8 oz (99.1  kg)  09/01/17 235 lb 9.6 oz (106.9 kg)    Physical Exam  Constitutional: He is oriented to person, place, and time. He appears well-developed and well-nourished. No distress.  HENT:  Head: Atraumatic.  Eyes: Conjunctivae are normal. Pupils are equal, round, and reactive to light. No scleral icterus.  Neck: Normal range of motion. Neck supple.  Cardiovascular: Normal rate and normal heart sounds.  Pulmonary/Chest: Effort normal and breath sounds normal. No respiratory distress.  Abdominal: Soft. Bowel sounds are normal. He exhibits no distension. There is no tenderness.  Musculoskeletal: Normal range of motion.  Neurological: He is alert and oriented to person, place, and time.  Skin: Skin is warm and dry.  Psychiatric: He has a normal mood and affect. His behavior is normal.  Nursing note and vitals reviewed.  Results for orders placed or performed in visit on 12/20/17  CBC with Differential/Platelet  Result Value Ref Range   WBC 7.0 3.4 - 10.8 x10E3/uL   RBC 5.41 4.14 - 5.80 x10E6/uL   Hemoglobin 16.3 13.0 - 17.7 g/dL   Hematocrit 45.9 37.5 - 51.0 %   MCV 85 79 - 97 fL   MCH 30.1 26.6 - 33.0 pg   MCHC 35.5 31.5 - 35.7 g/dL   RDW 13.1 12.3 - 15.4 %  Platelets 263 150 - 379 x10E3/uL   Neutrophils 55 Not Estab. %   Lymphs 34 Not Estab. %   Monocytes 7 Not Estab. %   Eos 3 Not Estab. %   Basos 1 Not Estab. %   Neutrophils Absolute 3.8 1.4 - 7.0 x10E3/uL   Lymphocytes Absolute 2.4 0.7 - 3.1 x10E3/uL   Monocytes Absolute 0.5 0.1 - 0.9 x10E3/uL   EOS (ABSOLUTE) 0.2 0.0 - 0.4 x10E3/uL   Basophils Absolute 0.0 0.0 - 0.2 x10E3/uL   Immature Granulocytes 0 Not Estab. %   Immature Grans (Abs) 0.0 0.0 - 0.1 x10E3/uL      Assessment & Plan:   Problem List Items Addressed This Visit      Digestive   Inflammatory bowel diseases (IBD)    Does not seem to be related to his past issues with UC, but will continue to monitor closely over the next few weeks in case worsening and pt  agreeable to GI referral if this is the case. Continue all OTC supplements as before. Watch diet carefully, bland, softer foods that are easier to digest.        Other Visit Diagnoses    Bloody stools    -  Primary   Await CBC. Pt afebrile, tolerating PO, and in only mild discomfort. Suspect just some irritation from recent dietary choices. Continue to monitor closely   Relevant Orders   CBC with Differential/Platelet (Completed)      Follow up plan: Return if symptoms worsen or fail to improve.

## 2017-12-21 LAB — CBC WITH DIFFERENTIAL/PLATELET
BASOS: 1 %
Basophils Absolute: 0 10*3/uL (ref 0.0–0.2)
EOS (ABSOLUTE): 0.2 10*3/uL (ref 0.0–0.4)
EOS: 3 %
HEMATOCRIT: 45.9 % (ref 37.5–51.0)
Hemoglobin: 16.3 g/dL (ref 13.0–17.7)
Immature Grans (Abs): 0 10*3/uL (ref 0.0–0.1)
Immature Granulocytes: 0 %
Lymphocytes Absolute: 2.4 10*3/uL (ref 0.7–3.1)
Lymphs: 34 %
MCH: 30.1 pg (ref 26.6–33.0)
MCHC: 35.5 g/dL (ref 31.5–35.7)
MCV: 85 fL (ref 79–97)
MONOS ABS: 0.5 10*3/uL (ref 0.1–0.9)
Monocytes: 7 %
NEUTROS ABS: 3.8 10*3/uL (ref 1.4–7.0)
Neutrophils: 55 %
PLATELETS: 263 10*3/uL (ref 150–379)
RBC: 5.41 x10E6/uL (ref 4.14–5.80)
RDW: 13.1 % (ref 12.3–15.4)
WBC: 7 10*3/uL (ref 3.4–10.8)

## 2017-12-23 NOTE — Assessment & Plan Note (Signed)
Does not seem to be related to his past issues with UC, but will continue to monitor closely over the next few weeks in case worsening and pt agreeable to GI referral if this is the case. Continue all OTC supplements as before. Watch diet carefully, bland, softer foods that are easier to digest.

## 2017-12-23 NOTE — Patient Instructions (Signed)
Follow up as needed

## 2018-01-01 ENCOUNTER — Telehealth: Payer: Self-pay | Admitting: Family Medicine

## 2018-01-01 DIAGNOSIS — K529 Noninfective gastroenteritis and colitis, unspecified: Secondary | ICD-10-CM

## 2018-01-01 NOTE — Telephone Encounter (Signed)
Referral generated

## 2018-01-01 NOTE — Telephone Encounter (Signed)
Copied from [place]. Topic: Quick Communication - See Telephone Encounter >> Jan 01, 2018  8:43 AM Boyd Kerbs wrote: CRM for notification. See Telephone encounter for:   Pt. Called saying symptoms are not getting better.  Seen Merrie Roof and she told him if not better call for referral . Still having blood in stool.   Will need an appt. With [facility] Thursday or later.   Please call pt. May leave message on cell phone.   01/01/18.

## 2018-01-04 ENCOUNTER — Encounter: Payer: Self-pay | Admitting: Gastroenterology

## 2018-01-04 ENCOUNTER — Ambulatory Visit: Payer: Managed Care, Other (non HMO) | Admitting: Gastroenterology

## 2018-01-04 VITALS — BP 131/90 | HR 98 | Temp 98.1°F | Ht 70.0 in | Wt 213.6 lb

## 2018-01-04 DIAGNOSIS — K640 First degree hemorrhoids: Secondary | ICD-10-CM | POA: Diagnosis not present

## 2018-01-04 DIAGNOSIS — K625 Hemorrhage of anus and rectum: Secondary | ICD-10-CM | POA: Diagnosis not present

## 2018-01-04 NOTE — Progress Notes (Signed)
Cephas Darby, MD [address]  [place]  [place],  [address]  Main: [phone]  Fax: [phone]    Gastroenterology Consultation  Referring Provider:     Volney American,* Primary Care Physician:  Volney American, PA-C Primary Gastroenterologist:  Dr. Cephas Darby Reason for Consultation:     Rectal bleeding        HPI:   Antonio Mueller is a 32 y.o.caucasian male referred by Volney American, PA-C  for consultation & management of rectal bleeding. Patient has been on keto diet since June 2018 and has been doing very well. For the last 2 weeks, he noticed mild left lower quadrant discomfort, episodes of rectal bleeding associated with pain radiating to lower back. He denies any diarrhea or constipation. He noticed blood on wiping as well as an surface of the stool. It lasted for 2 weeks that prompted him to be referred to GI. His CBC was normal. He reports good appetite, denies diarrhea or constipation, nausea, vomiting, fever, chills. Currently, he denies any rectal bleeding.  Workup in [place] in 2017 included stool studies negative for ova parasites, stool cultures negative, fecal Protectin normal, fecal lactoferrin negative, percentage fat normal, occult blood negative, Shiga toxin negative. Hemoglobin normal, ttg IgA normal, his total IgA normal, cRP normal, LFTs normal, TSH normal Colonoscopy normal including biopsies and terminal ileum.  NSAIDs: none  Antiplts/Anticoagulants/Anti thrombotics: none He denies family history of colon cancer in first-degree relatives or second-degree relatives Cousin with Crohn's disease  GI Procedures:  Colonoscopy at [facility], 12/13/2016 The colon mucosa appeared normal in the terminal ileum and throughout the entire examined colon. Multiple random biopsies were performed. A 7 mm semi-pedunculated polyp was found in the sigmoid colon, polypectomy was performed with the cold  snare.retroflexion in the rectum was normal. Random biopsies in the colon showed normal mucosa with no diagnostic abnormalities. Sigmoid colon Polyp inflammatory pseudopolyp  Past Medical History:  Diagnosis Date  . Colon polyp   . Inflammatory bowel diseases (IBD)   . Lichen striatus   . Migraine headache     Past Surgical History:  Procedure Laterality Date  . COLON SURGERY     Multiple Colonoscopys. Through childhood and recent.  [name] Kitchen HERNIA REPAIR     2 as a child    Prior to Admission medications   Medication Sig Start Date End Date Taking? Authorizing Provider  bifidobacterium infantis (ALIGN) capsule Take 1 capsule by mouth daily.   Yes [provider]  Probiotic Product (DIGESTIVE ADVANTAGE PO) Take by mouth.   Yes [provider]    Family History  Problem Relation Age of Onset  . Thyroid disease Mother   . Cancer Mother        Basal Cell Carinoma  . Hepatitis C Father   . Arthritis Father        Rheumatoid  . Cancer Maternal Grandmother        Breast  . Heart disease Maternal Grandfather   . Alzheimer's disease Maternal Grandfather   . Alzheimer's disease Paternal Grandmother   . Heart disease Paternal Grandfather   . Congestive Heart Failure Paternal Grandfather   . Parkinson's disease Paternal Grandfather   . Crohn's disease Cousin   . Cancer Other        Colon Cancer  . Cancer Other        Colon     Social History   Tobacco Use  . Smoking status: Never  Smoker  . Smokeless tobacco: Never Used  Substance Use Topics  . Alcohol use: Yes    Comment: Occasionally  . Drug use: No    Allergies as of 01/04/2018  . (No Known Allergies)    Review of Systems:    All systems reviewed and negative except where noted in HPI.   Physical Exam:  BP 131/90   Pulse 98   Temp 98.1 F (36.7 C) (Oral)   Ht 5\' 10"  (1.778 m)   Wt 213 lb 9.6 oz (96.9 kg)   BMI 30.65 kg/m  No LMP for male patient.  General:   Alert,  Well-developed,  well-nourished, pleasant and cooperative in NAD Head:  Normocephalic and atraumatic. Eyes:  Sclera clear, no icterus.   Conjunctiva pink. Ears:  Normal auditory acuity. Nose:  No deformity, discharge, or lesions. Mouth:  No deformity or lesions,oropharynx pink & moist. Neck:  Supple; no masses or thyromegaly. Lungs:  Respirations even and unlabored.  Clear throughout to auscultation.   No wheezes, crackles, or rhonchi. No acute distress. Heart:  Regular rate and rhythm; no murmurs, clicks, rubs, or gallops. Abdomen:  Normal bowel sounds. Soft, non-tender and non-distended without masses, hepatosplenomegaly or hernias noted.  No guarding or rebound tenderness.   Rectal: Not performed Msk:  Symmetrical without gross deformities. Good, equal movement & strength bilaterally. Pulses:  Normal pulses noted. Extremities:  No clubbing or edema.  No cyanosis. Neurologic:  Alert and oriented x3;  grossly normal neurologically. Skin:  Intact without significant lesions or rashes. No jaundice. Lymph Nodes:  No significant cervical adenopathy. Psych:  Alert and cooperative. Normal mood and affect.  Imaging Studies: none  Assessment and Plan:   Antonio Mueller is a 32 y.o. Caucasian male with medical history presents with intermittent episodes of rectal bleeding associated with pain radiating to back. He is currently asymptomatic. He had extensive workup done in [facility] and has been negative for inflammation bowel disease, microscopic colitis, celiac disease or infections. His probably dealing with outlet bleeding from hemorrhoids. I discussed with him about medical therapies to treat symptomatic hemorrhoids. I discussed with him about performing a flexible sigmoidoscopy when he has those discrete episodes of rectal bleeding associated with pain radiating to back. I also discussed with him about outpatient hemorrhoid ligation. Patient would like to follow-up with me if his symptoms recur.  He felt reassured   Follow up as needed   Cephas Darby, MD

## 2018-01-16 ENCOUNTER — Ambulatory Visit: Payer: Self-pay | Admitting: Gastroenterology

## 2018-02-07 ENCOUNTER — Encounter: Payer: Managed Care, Other (non HMO) | Admitting: Family Medicine

## 2018-02-14 ENCOUNTER — Encounter: Payer: Self-pay | Admitting: Family Medicine

## 2018-02-14 ENCOUNTER — Ambulatory Visit (INDEPENDENT_AMBULATORY_CARE_PROVIDER_SITE_OTHER): Payer: Managed Care, Other (non HMO) | Admitting: Family Medicine

## 2018-02-14 VITALS — BP 106/69 | HR 63 | Temp 97.7°F | Ht 67.6 in | Wt 209.6 lb

## 2018-02-14 DIAGNOSIS — K625 Hemorrhage of anus and rectum: Secondary | ICD-10-CM

## 2018-02-14 DIAGNOSIS — Z114 Encounter for screening for human immunodeficiency virus [HIV]: Secondary | ICD-10-CM | POA: Diagnosis not present

## 2018-02-14 DIAGNOSIS — Z0000 Encounter for general adult medical examination without abnormal findings: Secondary | ICD-10-CM

## 2018-02-14 DIAGNOSIS — Z113 Encounter for screening for infections with a predominantly sexual mode of transmission: Secondary | ICD-10-CM

## 2018-02-14 DIAGNOSIS — Z23 Encounter for immunization: Secondary | ICD-10-CM | POA: Diagnosis not present

## 2018-02-14 LAB — UA/M W/RFLX CULTURE, ROUTINE
Bilirubin, UA: NEGATIVE
Glucose, UA: NEGATIVE
KETONES UA: NEGATIVE
Leukocytes, UA: NEGATIVE
NITRITE UA: NEGATIVE
PH UA: 7 (ref 5.0–7.5)
Protein, UA: NEGATIVE
RBC, UA: NEGATIVE
Specific Gravity, UA: 1.01 (ref 1.005–1.030)
UUROB: 0.2 mg/dL (ref 0.2–1.0)

## 2018-02-14 NOTE — Patient Instructions (Addendum)
Td Vaccine (Tetanus and Diphtheria): What You Need to Know 1. Why get vaccinated? Tetanus  and diphtheria are very serious diseases. They are rare in the [place] today, but people who do become infected often have severe complications. Td vaccine is used to protect adolescents and adults from both of these diseases. Both tetanus and diphtheria are infections caused by bacteria. Diphtheria spreads from person to person through coughing or sneezing. Tetanus-causing bacteria enter the body through cuts, scratches, or wounds. TETANUS (lockjaw) causes painful muscle tightening and stiffness, usually all over the body.  It can lead to tightening of muscles in the head and neck so you can't open your mouth, swallow, or sometimes even breathe. Tetanus kills about 1 out of every 10 people who are infected even after receiving the best medical care.  DIPHTHERIA can cause a thick coating to form in the back of the throat.  It can lead to breathing problems, paralysis, heart failure, and death.  Before vaccines, as many as 200,000 cases of diphtheria and hundreds of cases of tetanus were reported in the [place] each year. Since vaccination began, reports of cases for both diseases have dropped by about 99%. 2. Td vaccine Td vaccine can protect adolescents and adults from tetanus and diphtheria. Td is usually given as a booster dose every 10 years but it can also be given earlier after a severe and dirty wound or burn. Another vaccine, called Tdap, which protects against pertussis in addition to tetanus and diphtheria, is sometimes recommended instead of Td vaccine. Your doctor or the person giving you the vaccine can give you more information. Td may safely be given at the same time as other vaccines. 3. Some people should not get this vaccine  A person who has ever had a life-threatening allergic reaction after a previous dose of any tetanus or diphtheria containing vaccine, OR has a severe  allergy to any part of this vaccine, should not get Td vaccine. Tell the person giving the vaccine about any severe allergies.  Talk to your doctor if you: ? had severe pain or swelling after any vaccine containing diphtheria or tetanus, ? ever had a condition called Guillain Barre Syndrome (GBS), ? aren't feeling well on the day the shot is scheduled. 4. What are the risks from Td vaccine? With any medicine, including vaccines, there is a chance of side effects. These are usually mild and go away on their own. Serious reactions are also possible but are rare. Most people who get Td vaccine do not have any problems with it. Mild problems following Td vaccine: (Did not interfere with activities)  Pain where the shot was given (about 8 people in 10)  Redness or swelling where the shot was given (about 1 person in 4)  Mild fever (rare)  Headache (about 1 person in 4)  Tiredness (about 1 person in 4)  Moderate problems following Td vaccine: (Interfered with activities, but did not require medical attention)  Fever over 102F (rare)  Severe problems following Td vaccine: (Unable to perform usual activities; required medical attention)  Swelling, severe pain, bleeding and/or redness in the arm where the shot was given (rare).  Problems that could happen after any vaccine:  People sometimes faint after a medical procedure, including vaccination. Sitting or lying down for about 15 minutes can help prevent fainting, and injuries caused by a fall. Tell your doctor if you feel dizzy, or have vision changes or ringing in the ears.  Some people get   severe pain in the shoulder and have difficulty moving the arm where a shot was given. This happens very rarely.  Any medication can cause a severe allergic reaction. Such reactions from a vaccine are very rare, estimated at fewer than 1 in a million doses, and would happen within a few minutes to a few hours after the vaccination. As with any  medicine, there is a very remote chance of a vaccine causing a serious injury or death. The safety of vaccines is always being monitored. For more information, visit: www.cdc.gov/vaccinesafety/ 5. What if there is a serious reaction? What should I look for? Look for anything that concerns you, such as signs of a severe allergic reaction, very high fever, or unusual behavior. Signs of a severe allergic reaction can include hives, swelling of the face and throat, difficulty breathing, a fast heartbeat, dizziness, and weakness. These would usually start a few minutes to a few hours after the vaccination. What should I do?  If you think it is a severe allergic reaction or other emergency that can't wait, call 9-1-1 or get the person to the nearest hospital. Otherwise, call your doctor.  Afterward, the reaction should be reported to the Vaccine Adverse Event Reporting System (VAERS). Your doctor might file this report, or you can do it yourself through the VAERS web site at www.vaers.hhs.gov, or by calling 1-800-822-7967. ? VAERS does not give medical advice. 6. The National Vaccine Injury Compensation Program The National Vaccine Injury Compensation Program (VICP) is a federal program that was created to compensate people who may have been injured by certain vaccines. Persons who believe they may have been injured by a vaccine can learn about the program and about filing a claim by calling 1-800-338-2382 or visiting the VICP website at www.hrsa.gov/vaccinecompensation. There is a time limit to file a claim for compensation. 7. How can I learn more?  Ask your doctor. He or she can give you the vaccine package insert or suggest other sources of information.  Call your local or state health department.  Contact the Centers for Disease Control and Prevention (CDC): ? Call 1-800-232-4636 (1-800-CDC-INFO) ? Visit CDC's website at www.cdc.gov/vaccines CDC Td Vaccine VIS (03/15/16) This information is  not intended to replace advice given to you by your health care provider. Make sure you discuss any questions you have with your health care provider. Document Released: 09/18/2006 Document Revised: 08/11/2016 Document Reviewed: 08/11/2016 Elsevier Interactive Patient Education  2017 [employer]  

## 2018-02-14 NOTE — Progress Notes (Signed)
BP 106/69   Pulse 63   Temp 97.7 F (36.5 C) (Oral)   Ht 5' 7.6" (1.717 m)   Wt 209 lb 9.6 oz (95.1 kg)   SpO2 98%   BMI 32.25 kg/m    Subjective:    Patient ID: Antonio Mueller, male    DOB: [birth year], 32 y.o.   MRN: [id number]  HPI: Antonio Mueller is a 32 y.o. male presenting on 02/14/2018 for comprehensive medical examination. Current medical complaints include:none  Does want new referral for second opinion from a GI specialist. Still having same sxs as several months ago with blood and mucus in stools, lower abdominal pain, and undigested food products in stools.   He currently lives with: wife Interim Problems from his last visit: yes  Depression Screen done today and results listed below:  Depression screen [facility] 2/9 02/14/2018  Decreased Interest 0  Down, Depressed, Hopeless 0  PHQ - 2 Score 0    The patient does not have a history of falls. I did not complete a risk assessment for falls. A plan of care for falls was not documented.   Past Medical History:  Past Medical History:  Diagnosis Date  . Colon polyp   . Inflammatory bowel diseases (IBD)   . Lichen striatus   . Migraine headache     Surgical History:  Past Surgical History:  Procedure Laterality Date  . COLON SURGERY     Multiple Colonoscopys. Through childhood and recent.  [name] Kitchen HERNIA REPAIR     1 as a child    Medications:  Current Outpatient Medications on File Prior to Visit  Medication Sig  . bifidobacterium infantis (ALIGN) capsule Take 1 capsule by mouth daily.  [name] Kitchen FIBER PO Take 5 capsules by mouth 3 (three) times daily.  . Probiotic Product (DIGESTIVE ADVANTAGE PO) Take by mouth.   No current facility-administered medications on file prior to visit.     Allergies:  No Known Allergies  Social History:  Social History   Socioeconomic History  . Marital status: Significant Other    Spouse name: Not on file  . Number of children: Not on file  . Years of education: Not on file  .  Highest education level: Not on file  Social Needs  . Financial resource strain: Not on file  . Food insecurity - worry: Not on file  . Food insecurity - inability: Not on file  . Transportation needs - medical: Not on file  . Transportation needs - non-medical: Not on file  Occupational History  . Not on file  Tobacco Use  . Smoking status: Never Smoker  . Smokeless tobacco: Never Used  Substance and Sexual Activity  . Alcohol use: Yes    Comment: Occasionally  . Drug use: No  . Sexual activity: Not on file  Other Topics Concern  . Not on file  Social History Narrative  . Not on file   Social History   Tobacco Use  Smoking Status Never Smoker  Smokeless Tobacco Never Used   Social History   Substance and Sexual Activity  Alcohol Use Yes   Comment: Occasionally    Family History:  Family History  Problem Relation Age of Onset  . Thyroid disease Mother   . Cancer Mother        Basal Cell Carinoma  . Hepatitis C Father   . Arthritis Father        Rheumatoid  . Cancer Maternal Grandmother  Breast  . Heart disease Maternal Grandfather   . Alzheimer's disease Maternal Grandfather   . Alzheimer's disease Paternal Grandmother   . Heart disease Paternal Grandfather   . Congestive Heart Failure Paternal Grandfather   . Parkinson's disease Paternal Grandfather   . Crohn's disease Cousin   . Cancer Other        Colon Cancer  . Cancer Other        Colon    Past medical history, surgical history, medications, allergies, family history and social history reviewed with patient today and changes made to appropriate areas of the chart.   Review of Systems - General ROS: negative Psychological ROS: negative Ophthalmic ROS: negative ENT ROS: negative Allergy and Immunology ROS: negative Respiratory ROS: no cough, shortness of breath, or wheezing Cardiovascular ROS: no chest pain or dyspnea on exertion Gastrointestinal ROS: positive for - abdominal pain and  melena Genito-Urinary ROS: no dysuria, trouble voiding, or hematuria Musculoskeletal ROS: negative Neurological ROS: no TIA or stroke symptoms Dermatological ROS: negative All other ROS negative except what is listed above and in the HPI.      Objective:    BP 106/69   Pulse 63   Temp 97.7 F (36.5 C) (Oral)   Ht 5' 7.6" (1.717 m)   Wt 209 lb 9.6 oz (95.1 kg)   SpO2 98%   BMI 32.25 kg/m   Wt Readings from Last 3 Encounters:  02/14/18 209 lb 9.6 oz (95.1 kg)  01/04/18 213 lb 9.6 oz (96.9 kg)  12/20/17 218 lb 8 oz (99.1 kg)    Physical Exam  Constitutional: He is oriented to person, place, and time. He appears well-developed and well-nourished. No distress.  HENT:  Head: Atraumatic.  Right Ear: External ear normal.  Left Ear: External ear normal.  Nose: Nose normal.  Mouth/Throat: Oropharynx is clear and moist.  Eyes: Conjunctivae are normal. Pupils are equal, round, and reactive to light. No scleral icterus.  Neck: Normal range of motion. Neck supple.  Cardiovascular: Normal rate, regular rhythm, normal heart sounds and intact distal pulses.  No murmur heard. Pulmonary/Chest: Effort normal and breath sounds normal. No respiratory distress.  Abdominal: Soft. Bowel sounds are normal. He exhibits no distension and no mass. There is no tenderness. There is no guarding.  Musculoskeletal: Normal range of motion. He exhibits no edema or tenderness.  Neurological: He is alert and oriented to person, place, and time. He has normal reflexes.  Skin: Skin is warm and dry. No rash noted.  Psychiatric: He has a normal mood and affect. His behavior is normal.  Nursing note and vitals reviewed.   Results for orders placed or performed in visit on 02/14/18  GC/Chlamydia Probe Amp  Result Value Ref Range   Chlamydia trachomatis, NAA Negative Negative   Neisseria gonorrhoeae by PCR Negative Negative  CBC with Differential/Platelet  Result Value Ref Range   WBC 7.1 3.4 - 10.8 x10E3/uL     RBC 5.48 4.14 - 5.80 x10E6/uL   Hemoglobin 16.4 13.0 - 17.7 g/dL   Hematocrit 46.7 37.5 - 51.0 %   MCV 85 79 - 97 fL   MCH 29.9 26.6 - 33.0 pg   MCHC 35.1 31.5 - 35.7 g/dL   RDW 12.8 12.3 - 15.4 %   Platelets 259 150 - 379 x10E3/uL   Neutrophils 58 Not Estab. %   Lymphs 31 Not Estab. %   Monocytes 7 Not Estab. %   Eos 4 Not Estab. %   Basos 0 Not  Estab. %   Neutrophils Absolute 4.1 1.4 - 7.0 x10E3/uL   Lymphocytes Absolute 2.2 0.7 - 3.1 x10E3/uL   Monocytes Absolute 0.5 0.1 - 0.9 x10E3/uL   EOS (ABSOLUTE) 0.3 0.0 - 0.4 x10E3/uL   Basophils Absolute 0.0 0.0 - 0.2 x10E3/uL   Immature Granulocytes 0 Not Estab. %   Immature Grans (Abs) 0.0 0.0 - 0.1 x10E3/uL  Comprehensive metabolic panel  Result Value Ref Range   Glucose 90 65 - 99 mg/dL   BUN 12 6 - 20 mg/dL   Creatinine, Ser 0.85 0.76 - 1.27 mg/dL   GFR calc non Af Amer 115 >59 mL/min/1.73   GFR calc Af Amer 133 >59 mL/min/1.73   BUN/Creatinine Ratio 14 9 - 20   Sodium 140 134 - 144 mmol/L   Potassium 4.1 3.5 - 5.2 mmol/L   Chloride 102 96 - 106 mmol/L   CO2 23 20 - 29 mmol/L   Calcium 9.6 8.7 - 10.2 mg/dL   Total Protein 7.1 6.0 - 8.5 g/dL   Albumin 4.5 3.5 - 5.5 g/dL   Globulin, Total 2.6 1.5 - 4.5 g/dL   Albumin/Globulin Ratio 1.7 1.2 - 2.2   Bilirubin Total 0.5 0.0 - 1.2 mg/dL   Alkaline Phosphatase 58 39 - 117 IU/L   AST 16 0 - 40 IU/L   ALT 18 0 - 44 IU/L  Lipid Panel w/o Chol/HDL Ratio  Result Value Ref Range   Cholesterol, Total 194 100 - 199 mg/dL   Triglycerides 124 0 - 149 mg/dL   HDL 42 >39 mg/dL   VLDL Cholesterol Cal 25 5 - 40 mg/dL   LDL Calculated 127 (H) 0 - 99 mg/dL  TSH  Result Value Ref Range   TSH 2.970 0.450 - 4.500 uIU/mL  UA/M w/rflx Culture, Routine  Result Value Ref Range   Specific Gravity, UA 1.010 1.005 - 1.030   pH, UA 7.0 5.0 - 7.5   Color, UA Yellow Yellow   Appearance Ur Clear Clear   Leukocytes, UA Negative Negative   Protein, UA Negative Negative/Trace   Glucose, UA  Negative Negative   Ketones, UA Negative Negative   RBC, UA Negative Negative   Bilirubin, UA Negative Negative   Urobilinogen, Ur 0.2 0.2 - 1.0 mg/dL   Nitrite, UA Negative Negative  HSV(herpes simplex vrs) 1+2 ab-IgG  Result Value Ref Range   HSV 1 Glycoprotein G Ab, IgG <0.91 0.00 - 0.90 index   HSV 2 IgG, Type Spec <0.91 0.00 - 0.90 index  RPR  Result Value Ref Range   RPR Ser Ql Non Reactive Non Reactive  HIV antibody  Result Value Ref Range   HIV Screen 4th Generation wRfx Non Reactive Non Reactive      Assessment & Plan:   Problem List Items Addressed This Visit      Digestive   Rectal bleeding   Relevant Orders   Ambulatory referral to Gastroenterology    Other Visit Diagnoses    Annual physical exam    -  Primary   Relevant Orders   CBC with Differential/Platelet (Completed)   Comprehensive metabolic panel (Completed)   Lipid Panel w/o Chol/HDL Ratio (Completed)   TSH (Completed)   UA/M w/rflx Culture, Routine (Completed)   Routine screening for STI (sexually transmitted infection)       Relevant Orders   HSV(herpes simplex vrs) 1+2 ab-IgG (Completed)   RPR (Completed)   GC/Chlamydia Probe Amp (Completed)   Encounter for screening for HIV  Relevant Orders   HIV antibody (Completed)   Need for Td vaccine       Relevant Orders   Td vaccine greater than or equal to 7yo preservative free IM (Completed)       Discussed aspirin prophylaxis for myocardial infarction prevention and decision was it was not indicated  LABORATORY TESTING:  Health maintenance labs ordered today as discussed above.   IMMUNIZATIONS:   - Tdap: Tetanus vaccination status reviewed: Td vaccination indicated and given today. - Influenza: Refused  PATIENT COUNSELING:    Sexuality: Discussed sexually transmitted diseases, partner selection, use of condoms, avoidance of unintended pregnancy  and contraceptive alternatives.   Advised to avoid cigarette smoking.  I discussed with  the patient that most people either abstain from alcohol or drink within safe limits (<=14/week and <=4 drinks/occasion for males, <=7/weeks and <= 3 drinks/occasion for females) and that the risk for alcohol disorders and other health effects rises proportionally with the number of drinks per week and how often a drinker exceeds daily limits.  Discussed cessation/primary prevention of drug use and availability of treatment for abuse.   Diet: Encouraged to adjust caloric intake to maintain  or achieve ideal body weight, to reduce intake of dietary saturated fat and total fat, to limit sodium intake by avoiding high sodium foods and not adding table salt, and to maintain adequate dietary potassium and calcium preferably from fresh fruits, vegetables, and low-fat dairy products.    stressed the importance of regular exercise  Injury prevention: Discussed safety belts, safety helmets, smoke detector, smoking near bedding or upholstery.   Dental health: Discussed importance of regular tooth brushing, flossing, and dental visits.   Follow up plan: NEXT PREVENTATIVE PHYSICAL DUE IN 1 YEAR. Return in about 1 year (around 02/15/2019) for CPE.

## 2018-02-15 ENCOUNTER — Encounter: Payer: Self-pay | Admitting: Family Medicine

## 2018-02-15 LAB — COMPREHENSIVE METABOLIC PANEL
ALT: 18 IU/L (ref 0–44)
AST: 16 IU/L (ref 0–40)
Albumin/Globulin Ratio: 1.7 (ref 1.2–2.2)
Albumin: 4.5 g/dL (ref 3.5–5.5)
Alkaline Phosphatase: 58 IU/L (ref 39–117)
BILIRUBIN TOTAL: 0.5 mg/dL (ref 0.0–1.2)
BUN / CREAT RATIO: 14 (ref 9–20)
BUN: 12 mg/dL (ref 6–20)
CHLORIDE: 102 mmol/L (ref 96–106)
CO2: 23 mmol/L (ref 20–29)
CREATININE: 0.85 mg/dL (ref 0.76–1.27)
Calcium: 9.6 mg/dL (ref 8.7–10.2)
GFR calc non Af Amer: 115 mL/min/{1.73_m2} (ref >59)
GFR, EST AFRICAN AMERICAN: 133 mL/min/{1.73_m2} (ref >59)
GLUCOSE: 90 mg/dL (ref 65–99)
Globulin, Total: 2.6 g/dL (ref 1.5–4.5)
Potassium: 4.1 mmol/L (ref 3.5–5.2)
Sodium: 140 mmol/L (ref 134–144)
Total Protein: 7.1 g/dL (ref 6.0–8.5)

## 2018-02-15 LAB — HIV ANTIBODY (ROUTINE TESTING W REFLEX): HIV Screen 4th Generation wRfx: NONREACTIVE

## 2018-02-15 LAB — LIPID PANEL W/O CHOL/HDL RATIO
Cholesterol, Total: 194 mg/dL (ref 100–199)
HDL: 42 mg/dL (ref >39)
LDL CALC: 127 mg/dL — AB (ref 0–99)
Triglycerides: 124 mg/dL (ref 0–149)
VLDL CHOLESTEROL CAL: 25 mg/dL (ref 5–40)

## 2018-02-15 LAB — CBC WITH DIFFERENTIAL/PLATELET
BASOS ABS: 0 10*3/uL (ref 0.0–0.2)
BASOS: 0 %
EOS (ABSOLUTE): 0.3 10*3/uL (ref 0.0–0.4)
EOS: 4 %
HEMOGLOBIN: 16.4 g/dL (ref 13.0–17.7)
Hematocrit: 46.7 % (ref 37.5–51.0)
Immature Grans (Abs): 0 10*3/uL (ref 0.0–0.1)
Immature Granulocytes: 0 %
LYMPHS: 31 %
Lymphocytes Absolute: 2.2 10*3/uL (ref 0.7–3.1)
MCH: 29.9 pg (ref 26.6–33.0)
MCHC: 35.1 g/dL (ref 31.5–35.7)
MCV: 85 fL (ref 79–97)
Monocytes Absolute: 0.5 10*3/uL (ref 0.1–0.9)
Monocytes: 7 %
NEUTROS ABS: 4.1 10*3/uL (ref 1.4–7.0)
Neutrophils: 58 %
Platelets: 259 10*3/uL (ref 150–379)
RBC: 5.48 x10E6/uL (ref 4.14–5.80)
RDW: 12.8 % (ref 12.3–15.4)
WBC: 7.1 10*3/uL (ref 3.4–10.8)

## 2018-02-15 LAB — RPR: RPR Ser Ql: NONREACTIVE

## 2018-02-15 LAB — HSV(HERPES SIMPLEX VRS) I + II AB-IGG
HSV 1 Glycoprotein G Ab, IgG: <0.91 index (ref 0.00–0.90)
HSV 2 IgG, Type Spec: <0.91 index (ref 0.00–0.90)

## 2018-02-15 LAB — TSH: TSH: 2.97 u[IU]/mL (ref 0.450–4.500)

## 2018-02-16 LAB — GC/CHLAMYDIA PROBE AMP
Chlamydia trachomatis, NAA: NEGATIVE
Neisseria gonorrhoeae by PCR: NEGATIVE

## 2018-02-19 ENCOUNTER — Telehealth: Payer: Self-pay | Admitting: Gastroenterology

## 2018-02-19 NOTE — Telephone Encounter (Signed)
Dr. Loletha Carrow Doc of the Day for 02/14/18 PM.   Please see Epic referral. Pt wanting second opinion on rectal bleeding, mucus mixed in with stools, undigested food products in stools x 2-3 months. 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 Saw [facility] last time  Dr. Verlin Grills office note is in [place]. Patient states that the other GI doctor he saw was in [place]. Patient says that Dr. Marius Ditch did not perform any testing on him and is requesting to come to our practice.

## 2018-02-20 NOTE — Telephone Encounter (Signed)
I reviewed Dr. Verlin Grills last note.  Just so Mr. Antonio Mueller is aware, the note indicates she was ready and willing to see him again as needed.    If he does not want to do so, and requests another opinion from our practice, he can be given the next available new patient appointment with any provider.  - HD

## 2018-02-21 NOTE — Telephone Encounter (Signed)
Left message for patient to return my call.

## 2018-02-22 ENCOUNTER — Encounter: Payer: Self-pay | Admitting: Gastroenterology

## 2018-02-23 ENCOUNTER — Telehealth: Payer: Self-pay | Admitting: Gastroenterology

## 2018-02-23 NOTE — Telephone Encounter (Signed)
Patient called and stated he has been talking to Dr. Marius Ditch in [place] and would like to go ahead and schedule a Flex Sigmoidoscopy

## 2018-02-27 ENCOUNTER — Other Ambulatory Visit: Payer: Self-pay

## 2018-02-27 DIAGNOSIS — K625 Hemorrhage of anus and rectum: Secondary | ICD-10-CM

## 2018-03-02 ENCOUNTER — Encounter: Payer: Self-pay | Admitting: *Deleted

## 2018-03-05 ENCOUNTER — Encounter: Payer: Self-pay | Admitting: Certified Registered Nurse Anesthetist

## 2018-03-05 ENCOUNTER — Encounter: Payer: Self-pay | Admitting: *Deleted

## 2018-03-05 ENCOUNTER — Ambulatory Visit
Admission: RE | Admit: 2018-03-05 | Discharge: 2018-03-05 | Disposition: A | Discharge status: Home / Self Care | Payer: Managed Care, Other (non HMO) | Source: Ambulatory Visit | Attending: Gastroenterology | Admitting: Gastroenterology

## 2018-03-05 ENCOUNTER — Other Ambulatory Visit: Payer: Self-pay

## 2018-03-05 ENCOUNTER — Encounter
Admission: RE | Disposition: A | Discharge status: Home / Self Care | Payer: Self-pay | Source: Ambulatory Visit | Attending: Gastroenterology

## 2018-03-05 DIAGNOSIS — K621 Rectal polyp: Secondary | ICD-10-CM | POA: Insufficient documentation

## 2018-03-05 DIAGNOSIS — K625 Hemorrhage of anus and rectum: Secondary | ICD-10-CM | POA: Diagnosis present

## 2018-03-05 DIAGNOSIS — K589 Irritable bowel syndrome without diarrhea: Secondary | ICD-10-CM | POA: Diagnosis not present

## 2018-03-05 HISTORY — PX: COLONOSCOPY: SHX5424

## 2018-03-05 SURGERY — COLONOSCOPY

## 2018-03-05 MED ORDER — FENTANYL CITRATE (PF) 100 MCG/2ML IJ SOLN
INTRAMUSCULAR | Status: DC | PRN
  Administered 2018-03-05 (×4): 25 ug via INTRAVENOUS
  Administered 2018-03-05: 50 ug via INTRAVENOUS

## 2018-03-05 MED ORDER — SODIUM CHLORIDE 0.9 % IV SOLN
INTRAVENOUS | Status: DC
  Administered 2018-03-05: 12:00:00 via INTRAVENOUS

## 2018-03-05 MED ORDER — MIDAZOLAM HCL 5 MG/5ML IJ SOLN
INTRAMUSCULAR | Status: AC
  Filled 2018-03-05: qty 10

## 2018-03-05 MED ORDER — MIDAZOLAM HCL 5 MG/5ML IJ SOLN
INTRAMUSCULAR | Status: DC | PRN
  Administered 2018-03-05 (×2): 1 mg via INTRAVENOUS
  Administered 2018-03-05: 2 mg via INTRAVENOUS
  Administered 2018-03-05: 1 mg via INTRAVENOUS

## 2018-03-05 MED ORDER — FENTANYL CITRATE (PF) 100 MCG/2ML IJ SOLN
INTRAMUSCULAR | Status: AC
  Filled 2018-03-05: qty 4

## 2018-03-05 NOTE — H&P (Signed)
Cephas Darby, MD [address]  [place]  [place], [place] [address]  Main: [phone]  Fax: [phone] Pager: [phone]  Primary Care Physician:  Volney American, PA-C Primary Gastroenterologist:  Dr. Cephas Darby  Pre-Procedure History & Physical: HPI:  Antonio Mueller is a 32 y.o. male is here for an flexible sigmoidoscopy.   Past Medical History:  Diagnosis Date  . Colon polyp   . Inflammatory bowel diseases (IBD)   . Lichen striatus   . Migraine headache     Past Surgical History:  Procedure Laterality Date  . COLONOSCOPY W/ POLYPECTOMY    . FLEXIBLE SIGMOIDOSCOPY    . HERNIA REPAIR     1 as a child    Prior to Admission medications   Medication Sig Start Date End Date Taking? Authorizing Provider  bifidobacterium infantis (ALIGN) capsule Take 1 capsule by mouth daily.   Yes [provider]  FIBER PO Take 5 capsules by mouth 3 (three) times daily.   Yes [provider]  Multiple Vitamins-Minerals (MULTIVITAMIN WITH MINERALS) tablet Take 1 tablet by mouth daily.   Yes [provider]  Probiotic Product (DIGESTIVE ADVANTAGE PO) Take by mouth.   Yes [provider]    Allergies as of 02/27/2018  . (No Known Allergies)    Family History  Problem Relation Age of Onset  . Thyroid disease Mother   . Cancer Mother        Basal Cell Carinoma  . Hepatitis C Father   . Arthritis Father        Rheumatoid  . Cancer Maternal Grandmother        Breast  . Heart disease Maternal Grandfather   . Alzheimer's disease Maternal Grandfather   . Alzheimer's disease Paternal Grandmother   . Heart disease Paternal Grandfather   . Congestive Heart Failure Paternal Grandfather   . Parkinson's disease Paternal Grandfather   . Crohn's disease Cousin   . Cancer Other        Colon Cancer  . Cancer Other        Colon    Social History   Socioeconomic History  . Marital status: Significant Other    Spouse name: Not on  file  . Number of children: Not on file  . Years of education: Not on file  . Highest education level: Not on file  Occupational History  . Not on file  Social Needs  . Financial resource strain: Not on file  . Food insecurity:    Worry: Not on file    Inability: Not on file  . Transportation needs:    Medical: Not on file    Non-medical: Not on file  Tobacco Use  . Smoking status: Never Smoker  . Smokeless tobacco: Never Used  Substance and Sexual Activity  . Alcohol use: Yes    Comment: Occasionally  . Drug use: No  . Sexual activity: Not on file  Lifestyle  . Physical activity:    Days per week: Not on file    Minutes per session: Not on file  . Stress: Not on file  Relationships  . Social connections:    Talks on phone: Not on file    Gets together: Not on file    Attends religious service: Not on file    Active member of club or organization: Not on file    Attends meetings of clubs or organizations: Not on file    Relationship status: Not on file  .  Intimate partner violence:    Fear of current or ex partner: Not on file    Emotionally abused: Not on file    Physically abused: Not on file    Forced sexual activity: Not on file  Other Topics Concern  . Not on file  Social History Narrative  . Not on file    Review of Systems: See HPI, otherwise negative ROS  Physical Exam: BP 123/82   Pulse 76   Temp (!) 97.3 F (36.3 C) (Tympanic)   Resp 18   Ht 5\' 10"  (1.778 m)   Wt 209 lb (94.8 kg)   SpO2 98%   BMI 29.99 kg/m  General:   Alert,  pleasant and cooperative in NAD Head:  Normocephalic and atraumatic. Neck:  Supple; no masses or thyromegaly. Lungs:  Clear throughout to auscultation.    Heart:  Regular rate and rhythm. Abdomen:  Soft, nontender and nondistended. Normal bowel sounds, without guarding, and without rebound.   Neurologic:  Alert and  oriented x4;  grossly normal neurologically.  Impression/Plan: Ho Parisi is here for an flexible  sigmoidoscopy to be performed for rectal bleeding  Risks, benefits, limitations, and alternatives regarding  flexible sigmoidoscopy have been reviewed with the patient.  Questions have been answered.  All parties agreeable.   Sherri Sear, MD  03/05/2018, 11:54 AM

## 2018-03-05 NOTE — Op Note (Signed)
[facility] Gastroenterology Patient Name: Antonio Mueller Procedure Date: 03/05/2018 11:49 AM MRN: [id number] Account #: [id number] Date of Birth: 12-26-1985 Admit Type: Outpatient Age: 32 Room: [facility] ENDO ROOM 2 Gender: Male Note Status: Finalized Procedure:            Colonoscopy Indications:          Rectal bleeding Providers:            Lin Landsman MD, MD Referring MD:         Guadalupe Maple, MD (Referring MD) Medicines:            Fentanyl 150 micrograms IV, Midazolam 5 mg IV Complications:        No immediate complications. Estimated blood loss: None. Procedure:            Pre-Anesthesia Assessment:                       - Prior to the procedure, a History and Physical was                        performed, and patient medications and allergies were                        reviewed. The patient is competent. The risks and                        benefits of the procedure and the sedation options and                        risks were discussed with the patient. All questions                        were answered and informed consent was obtained.                        Patient identification and proposed procedure were                        verified by the physician, the nurse, the                        anesthesiologist, the anesthetist and the technician in                        the pre-procedure area in the procedure room in the                        endoscopy suite. Mental Status Examination: alert and                        oriented. Airway Examination: normal oropharyngeal                        airway and neck mobility. Respiratory Examination:                        clear to auscultation. CV Examination: normal.                        Prophylactic Antibiotics: The patient does not require  prophylactic antibiotics. Prior Anticoagulants: The                        patient has taken no previous anticoagulant or          antiplatelet agents. ASA Grade Assessment: II - A                        patient with mild systemic disease. After reviewing the                        risks and benefits, the patient was deemed in                        satisfactory condition to undergo the procedure. The                        anesthesia plan was to use moderate sedation /                        analgesia (conscious sedation). Immediately prior to                        administration of medications, the patient was                        re-assessed for adequacy to receive sedatives. The                        heart rate, respiratory rate, oxygen saturations, blood                        pressure, adequacy of pulmonary ventilation, and                        response to care were monitored throughout the                        procedure. The physical status of the patient was                        re-assessed after the procedure.                       After obtaining informed consent, the colonoscope was                        passed under direct vision. Throughout the procedure,                        the patient's blood pressure, pulse, and oxygen                        saturations were monitored continuously. The Endoscope                        was introduced through the anus and advanced to the the                        terminal ileum. The colonoscopy was performed without  difficulty. The patient tolerated the procedure fairly                        well. The quality of the bowel preparation was                        evaluated using the BBPS [facility] Bowel Preparation                        Scale) with scores of: Right Colon = 2 (minor amount of                        residual staining, small fragments of stool and/or                        opaque liquid, but mucosa seen well), Transverse Colon                        = 3 (entire mucosa seen well with no residual staining,                         small fragments of stool or opaque liquid) and Left                        Colon = 3 (entire mucosa seen well with no residual                        staining, small fragments of stool or opaque liquid).                        The total BBPS score equals 8. The quality of the bowel                        preparation was good. Findings:      The perianal and digital rectal examinations were normal. Pertinent       negatives include normal sphincter tone and no palpable rectal lesions.      A 30 mm inflammatory appearing polyp was found in the rectum at 20 cm       proximal to the anus. The polyp was pedunculated. The polyp was removed       with a hot snare. Resection and retrieval were complete. Area opposite       and distal to the polypectomy site was tattooed with an injection of       [place] ink.      The retroflexed view of the distal rectum and anal verge was normal and       showed no anal or rectal abnormalities.      Normal mucosa was found in the entire colon.      The terminal ileum appeared normal. Impression:           - One 30 mm polyp in the rectum at 20 cm proximal to                        the anus, removed with a hot snare. Resected and                        retrieved. Tattooed. Source of  rectal bleeding                       - The distal rectum and anal verge are normal on                        retroflexion view.                       - Normal mucosa in the entire examined colon.                       - The examined portion of the ileum was normal. Recommendation:       - Discharge patient to home.                       - Resume previous diet today.                       - Continue present medications.                       - Await pathology results.                       - Repeat colonoscopy in 3 - 5 years for surveillance                        based on pathology results. Procedure Code(s):    --- Professional ---                       [phone],  Colonoscopy, flexible; with removal of tumor(s),                        polyp(s), or other lesion(s) by snare technique                       [id number], Colonoscopy, flexible; with directed submucosal                        injection(s), any substance Diagnosis Code(s):    --- Professional ---                       K62.1, Rectal polyp                       K62.5, Hemorrhage of anus and rectum CPT copyright 2016 [employer]. All rights reserved. The codes documented in this report are preliminary and upon coder review may  be revised to meet current compliance requirements. Dr. Ulyess Mort Lin Landsman MD, MD 03/05/2018 12:47:22 PM This report has been signed electronically. Number of Addenda: 0 Note Initiated On: 03/05/2018 11:49 AM Scope Withdrawal Time: 0 hours 25 minutes 33 seconds  Total Procedure Duration: 0 hours 32 minutes 26 seconds       [facility]

## 2018-03-07 ENCOUNTER — Encounter: Payer: Self-pay | Admitting: Gastroenterology

## 2018-03-07 LAB — SURGICAL PATHOLOGY

## 2018-03-13 ENCOUNTER — Encounter: Payer: Self-pay | Admitting: Gastroenterology

## 2018-06-05 ENCOUNTER — Encounter: Payer: Self-pay | Admitting: Family Medicine

## 2018-06-05 ENCOUNTER — Ambulatory Visit: Payer: Managed Care, Other (non HMO) | Admitting: Family Medicine

## 2018-06-05 VITALS — BP 110/69 | HR 86 | Temp 98.1°F | Ht 70.0 in | Wt 218.1 lb

## 2018-06-05 DIAGNOSIS — R413 Other amnesia: Secondary | ICD-10-CM

## 2018-06-05 DIAGNOSIS — J3489 Other specified disorders of nose and nasal sinuses: Secondary | ICD-10-CM | POA: Diagnosis not present

## 2018-06-05 MED ORDER — FLUTICASONE PROPIONATE 50 MCG/ACT NA SUSP: 2.0000 | Freq: Every day | NASAL | 6 refills | Status: DC

## 2018-06-05 MED ORDER — PREDNISONE 10 MG PO TABS: ORAL_TABLET | ORAL | 0 refills | Status: DC

## 2018-06-05 NOTE — Progress Notes (Signed)
BP 110/69 (BP Location: Right Arm, Patient Position: Sitting, Cuff Size: Large)   Pulse 86   Temp 98.1 F (36.7 C) (Oral)   Ht 5\' 10"  (1.778 m)   Wt 218 lb 1.6 oz (98.9 kg)   SpO2 97%   BMI 31.29 kg/m    Subjective:    Patient ID: Antonio Mueller, male    DOB: 07-Nov-1986, 32 y.o.   MRN: [id number]  HPI: Antonio Mueller is a 33 y.o. male  Chief Complaint  Patient presents with  . Anosmia  . Trouble Speaking  . Sinusitis    Sinus pressure on right side of face, under eye. Ongoing 2 months.   2 months of facial pain and pressure, right eye/maxillary sinuses. Has had these issues off and on in the past, typically comes with headaches and blurred vision. Around the same time period, have points throughout the day where his throat feels congested, voice cracks. No fevers, chills, body aches. Pain improves with advil but he doesn't take it often. Not aware of any issues with allergic rhinitis.  Also dealing with increasing anosmia x 2-3 years. Can still smells very pungent odors, but mostly has lost sense of smell. Seems worse with sinus infections.   Noticed after having propofol during a colonoscopy 3 years ago several weeks of short term memory issues, word jumbling, etc. Still feels like he occasionally jumbles words, loses thoughts while speaking at work. Had another colonoscopy recently and had a shorter episode of this as well which is ongoing. Pt and significant other concerned that things never fully returned to baseline. Denies visual changes, dysphagia, gait abnormalities.   Past Medical History:  Diagnosis Date  . Colon polyp   . Inflammatory bowel diseases (IBD)   . Lichen striatus   . Migraine headache    Social History   Socioeconomic History  . Marital status: Significant Other    Spouse name: Not on file  . Number of children: Not on file  . Years of education: Not on file  . Highest education level: Not on file  Occupational History  . Not on file  Social Needs    . Financial resource strain: Not on file  . Food insecurity:    Worry: Not on file    Inability: Not on file  . Transportation needs:    Medical: Not on file    Non-medical: Not on file  Tobacco Use  . Smoking status: Never Smoker  . Smokeless tobacco: Never Used  Substance and Sexual Activity  . Alcohol use: Yes    Comment: Occasionally  . Drug use: No  . Sexual activity: Not on file  Lifestyle  . Physical activity:    Days per week: Not on file    Minutes per session: Not on file  . Stress: Not on file  Relationships  . Social connections:    Talks on phone: Not on file    Gets together: Not on file    Attends religious service: Not on file    Active member of club or organization: Not on file    Attends meetings of clubs or organizations: Not on file    Relationship status: Not on file  . Intimate partner violence:    Fear of current or ex partner: Not on file    Emotionally abused: Not on file    Physically abused: Not on file    Forced sexual activity: Not on file  Other Topics Concern  . Not on file  Social History Narrative  . Not on file   Relevant past medical, surgical, family and social history reviewed and updated as indicated. Interim medical history since our last visit reviewed. Allergies and medications reviewed and updated.  Review of Systems  Per HPI unless specifically indicated above     Objective:    BP 110/69 (BP Location: Right Arm, Patient Position: Sitting, Cuff Size: Large)   Pulse 86   Temp 98.1 F (36.7 C) (Oral)   Ht 5\' 10"  (1.778 m)   Wt 218 lb 1.6 oz (98.9 kg)   SpO2 97%   BMI 31.29 kg/m   Wt Readings from Last 3 Encounters:  06/05/18 218 lb 1.6 oz (98.9 kg)  03/05/18 209 lb (94.8 kg)  02/14/18 209 lb 9.6 oz (95.1 kg)    Physical Exam  Constitutional: He is oriented to person, place, and time. He appears well-developed and well-nourished. No distress.  HENT:  Right Ear: External ear normal.  Left Ear: External ear  normal.  Mouth/Throat: Oropharynx is clear and moist.  Nasal mucosa erythematous  Eyes: Pupils are equal, round, and reactive to light. Conjunctivae are normal.  Neck: Normal range of motion. Neck supple.  Cardiovascular: Normal rate and regular rhythm.  Pulmonary/Chest: Effort normal and breath sounds normal. No respiratory distress.  Musculoskeletal: Normal range of motion.  Neurological: He is alert and oriented to person, place, and time. No cranial nerve deficit. Coordination normal.  Skin: Skin is warm and dry.  Psychiatric: He has a normal mood and affect. His behavior is normal.  Nursing note and vitals reviewed.   Results for orders placed or performed during the hospital encounter of 03/05/18  Surgical pathology  Result Value Ref Range   SURGICAL PATHOLOGY      Surgical Pathology CASE: [id number] PATIENT: Adrion Labrada Surgical Pathology Report     SPECIMEN SUBMITTED: A. Inflammatory polyp rectum, rule dysplasia and malignancy; hot snare  CLINICAL HISTORY: None provided  PRE-OPERATIVE DIAGNOSIS: Rectal bleeding K62.5 Grade 1 internal hemorrhoids K64.0  POST-OPERATIVE DIAGNOSIS: Colon mass     DIAGNOSIS: A.  INFLAMMATORY POLYP OF RECTUM; HOT SNARE: - POLYPOID COLONIC MUCOSA WITH EXUDATE, GRANULATION TISSUE, INFLAMMATION OF THE LAMINA PROPRIA, VASCULAR CONGESTION AND HYPERPLASTIC CHANGES. - NEGATIVE FOR DYSPLASIA AND MALIGNANCY.  Note: The differential diagnosis includes mucosal prolapse-type polyp (inflammatory cloacogenic polyp) and retention-type polyp.   GROSS DESCRIPTION:  A. Labeled: hot snare inflammatory polyp rectum to rule out dysplasia and malignancy  Tissue fragment(s): 1  Size: 1.8 x 1.6 x 1.3 cm  Description: in formalin, purple-tan fragment, inked black at the base and section  Enti rely submitted in 1-3 cassette(s).          Final Diagnosis performed by Delorse Lek, MD.  Electronically signed 03/07/2018  2:30:27PM    The electronic signature indicates that the named Attending Pathologist has evaluated the specimen  Technical component performed at [facility], [address] [place], [place] [address] Lab: [phone] Dir: Rush Farmer, MD, MMM  Professional component performed at [facility], [facility], [place], [place], [place] [address] Lab: [phone] Dir: Dellia Nims. Reuel Derby, MD        Assessment & Plan:   Problem List Items Addressed This Visit    None    Visit Diagnoses    Memory change    -  Primary   No focal neurologic deficits and pt reports progressive improvement, but given his concern and duration of issue will refer to Neurology for further eval   Relevant  Orders   Ambulatory referral to Neurology   Sinus pressure       Suspect chronic intermittent sinus inflammation causing most of his reported sxs, start flonase and zyrtec as well as a prednisone taper and monitor for benefit       Follow up plan: Return for as scheduled.

## 2018-06-07 NOTE — Patient Instructions (Signed)
Follow up as scheduled.  

## 2018-08-10 ENCOUNTER — Other Ambulatory Visit: Payer: Self-pay | Admitting: Acute Care

## 2018-08-10 DIAGNOSIS — M6281 Muscle weakness (generalized): Secondary | ICD-10-CM

## 2018-08-20 ENCOUNTER — Ambulatory Visit: Payer: Managed Care, Other (non HMO) | Admitting: Internal Medicine

## 2018-08-20 ENCOUNTER — Encounter: Payer: Self-pay | Admitting: Internal Medicine

## 2018-08-20 DIAGNOSIS — R29898 Other symptoms and signs involving the musculoskeletal system: Secondary | ICD-10-CM

## 2018-08-20 NOTE — Progress Notes (Signed)
Patient ID: Antonio Mueller, male   DOB: 1986/09/28, 32 y.o.   MRN: [id number]     [facility]      Reason for Consult: positive Western Blot for Borrelia burgdorfi.     Referring Physician: Chipper Herb, NP    Patient ID: Antonio Mueller, male    DOB: 1985/12/06, 32 y.o.   MRN: [id number]  HPI:   He relates a long history of GI issues without established GI diagnosis.  More recently, he underwent colonoscopy and following propofol administration developed issues with word finding/aphasia and memory loss.  He has had now progressive symptoms of right side weakness which is has trouble describing.  He is unsure if it is muscle or tendons.  He gets bilateral hand tremors and headaches.  He went to see neurology and undergoing work up with them including upcoming MRI.  He has lived in many places including [place] and knows both his parents have had Lyme disease in the past, with symptoms of facial droop in one parent and arthritis in another parent.  He had asked for a Lyme serology test to be done and is positive with >5 IgG bands.  He though has no history of facial palsey, no history of meningitis, heart block, knee arthritis, EM rash or any previous history of Lyme disease symptoms.  Previous record reviewed from neurology and has a normal CK and positive Lyme Western Blot.    Past Medical History:  Diagnosis Date  . Colon polyp   . Inflammatory bowel diseases (IBD)   . Lichen striatus   . Migraine headache     Prior to Admission medications   Medication Sig Start Date End Date Taking? Authorizing Provider  bifidobacterium infantis (ALIGN) capsule Take 1 capsule by mouth daily.   Yes [provider]  FIBER PO Take 5 capsules by mouth 3 (three) times daily.   Yes [provider]  Multiple Vitamins-Minerals (MULTIVITAMIN WITH MINERALS) tablet Take 1 tablet by mouth daily.   Yes [provider]  Probiotic Product (DIGESTIVE ADVANTAGE PO) Take by  mouth.   Yes [provider]  fluticasone (FLONASE) 50 MCG/ACT nasal spray Place 2 sprays into both nostrils daily. 06/05/18   Volney American, PA-C  predniSONE (DELTASONE) 10 MG tablet Take 6 tabs day one, 5 tabs day two, 4 tabs day three, etc 06/05/18   Volney American, PA-C    Allergies  Allergen Reactions  . Nickel Rash    Social History   Tobacco Use  . Smoking status: Never Smoker  . Smokeless tobacco: Never Used  Substance Use Topics  . Alcohol use: Yes    Comment: Occasionally  . Drug use: No    Family History  Problem Relation Age of Onset  . Thyroid disease Mother   . Cancer Mother        Basal Cell Carinoma  . Hepatitis C Father   . Arthritis Father        Rheumatoid  . Cancer Maternal Grandmother        Breast  . Heart disease Maternal Grandfather   . Alzheimer's disease Maternal Grandfather   . Alzheimer's disease Paternal Grandmother   . Heart disease Paternal Grandfather   . Congestive Heart Failure Paternal Grandfather   . Parkinson's disease Paternal Grandfather   . Crohn's disease Cousin   . Cancer Other        Colon Cancer  . Cancer Other        Colon  Review of Systems  Constitutional: negative for fatigue, malaise and anorexia Integument/breast: negative for rash Musculoskeletal: positive for muscle weakness, negative for myalgias and arthralgias Neurological: positive for headaches, memory problems, speech problems and gait problems, negative for seizures Behavioral/Psych: negative for behavior problems and depression All other systems reviewed and are negative    Constitutional: in no apparent distress and alert  Vitals:   08/20/18 1344  BP: 139/88  Pulse: 93  Temp: 98.1 F (36.7 C)   EYES: anicteric ENMT: no thrush Cardiovascular: Cor RRR Respiratory: CTA B; normal respiratory effort GI: Bowel sounds are normal, liver is not enlarged, spleen is not enlarged Musculoskeletal: no pedal edema noted Skin:  negatives: no rash Hematologic: no cervical lad  Labs: Lab Results  Component Value Date   WBC 7.1 02/14/2018   HGB 16.4 02/14/2018   HCT 46.7 02/14/2018   MCV 85 02/14/2018   PLT 259 02/14/2018    Lab Results  Component Value Date   CREATININE 0.85 02/14/2018   BUN 12 02/14/2018   NA 140 02/14/2018   K 4.1 02/14/2018   CL 102 02/14/2018   CO2 23 02/14/2018    Lab Results  Component Value Date   ALT 18 02/14/2018   AST 16 02/14/2018   ALKPHOS 58 02/14/2018   BILITOT 0.5 02/14/2018     Assessment: positive test without appropriate clinical context.  I discussed the symptoms of Lyme disease, appropriate times to test and possibility of false positive tests in situations with a low pre-test probability.  I discussed that his current neurologic symptoms do not represent manifestations of Lyme disease, particularly without the previous clinical manifestations of symptoms such as knee arthritis, meningitis, EM rash, facial palsy.  I discussed that any late manifestations of untreated Lyme would not only present with late neurologic symptoms but would be following more typical symptoms.  Therefore further testing with CSF studies not indicated and I would recommend no Lyme testing without the appropriate clinical syndrome.  Plan: 1) continue to follow with your neurologist.

## 2018-08-24 ENCOUNTER — Ambulatory Visit: Payer: Managed Care, Other (non HMO)

## 2018-09-08 ENCOUNTER — Ambulatory Visit
Admission: RE | Admit: 2018-09-08 | Discharge: 2018-09-08 | Disposition: A | Discharge status: Home / Self Care | Payer: Managed Care, Other (non HMO) | Source: Ambulatory Visit | Attending: Acute Care | Admitting: Acute Care

## 2018-09-08 DIAGNOSIS — M6281 Muscle weakness (generalized): Secondary | ICD-10-CM | POA: Diagnosis not present

## 2018-09-08 DIAGNOSIS — R202 Paresthesia of skin: Secondary | ICD-10-CM | POA: Insufficient documentation

## 2018-09-08 DIAGNOSIS — R200 Anesthesia of skin: Secondary | ICD-10-CM | POA: Diagnosis not present

## 2018-09-08 DIAGNOSIS — R251 Tremor, unspecified: Secondary | ICD-10-CM | POA: Insufficient documentation

## 2018-09-08 DIAGNOSIS — M6289 Other specified disorders of muscle: Secondary | ICD-10-CM | POA: Diagnosis present

## 2019-06-28 ENCOUNTER — Ambulatory Visit: Payer: Self-pay | Admitting: Nurse Practitioner

## 2019-07-03 ENCOUNTER — Ambulatory Visit: Payer: Self-pay | Admitting: Nurse Practitioner

## 2019-07-04 ENCOUNTER — Other Ambulatory Visit: Payer: Self-pay

## 2019-07-04 ENCOUNTER — Ambulatory Visit (INDEPENDENT_AMBULATORY_CARE_PROVIDER_SITE_OTHER): Payer: Managed Care, Other (non HMO) | Admitting: Family Medicine

## 2019-07-04 ENCOUNTER — Encounter: Payer: Self-pay | Admitting: Family Medicine

## 2019-07-04 VITALS — Temp 98.0°F | Ht 70.0 in | Wt 235.0 lb

## 2019-07-04 DIAGNOSIS — R5381 Other malaise: Secondary | ICD-10-CM | POA: Diagnosis not present

## 2019-07-04 DIAGNOSIS — M542 Cervicalgia: Secondary | ICD-10-CM

## 2019-07-04 DIAGNOSIS — M2550 Pain in unspecified joint: Secondary | ICD-10-CM

## 2019-07-04 DIAGNOSIS — R221 Localized swelling, mass and lump, neck: Secondary | ICD-10-CM

## 2019-07-04 DIAGNOSIS — G8929 Other chronic pain: Secondary | ICD-10-CM

## 2019-07-04 DIAGNOSIS — M25511 Pain in right shoulder: Secondary | ICD-10-CM

## 2019-07-04 NOTE — Progress Notes (Signed)
Temp 98 F (36.7 C) (Temporal)    Ht 5' 10" (1.778 m)    Wt 235 lb (106.6 kg)    BMI 33.72 kg/m    Subjective:    Patient ID: Antonio Mueller, male    DOB: 01/12/1986, 33 y.o.   MRN: [id number]  HPI: Antonio Mueller is a 33 y.o. male  Chief Complaint  Patient presents with   Throat Problem    Throat feels like it's swelling. Feeling uncomfortable. It doesn't feel severely sore like strep. Ongoing months.    Shoulder/Joint Pain     This visit was completed via WebEx due to the restrictions of the COVID-19 pandemic. All issues as above were discussed and addressed. Physical exam was done as above through visual confirmation on WebEx. If it was felt that the patient should be evaluated in the office, they were directed there. The patient verbally consented to this visit.  Location of the patient: home  Location of the provider: work  Those involved with this call:   Provider: Merrie Roof, PA-C  CMA: Merilyn Baba, [address] Desk/Registration: Jill Side   Time spent on call: 30 minutes with patient face to face via video conference. More than 50% of this time was spent in counseling and coordination of care. 10 minutes total spent in review of patient's record and preparation of their chart. I verified patient identity using two factors (patient name and date of birth). Patient consents verbally to being seen via telemedicine visit today.   Has not been feeling well the past 4-5 months. States he's been having throat swelling sensation, right shoulder pain, and neck soreness. These all seem to be progressively worsening.   Throat sensation comes with intermittent voice cracking and and hoarseness. Does a lot of presentations at work and people have noticed a change. Does also feel like there's a bump on the right side of the neck. Mother had thyroid nodules and had to have thyroid removed. He is concerned about this. Denies dysphagia to solids or liquids, unexplained weight  loss, night sweats, fevers, difficulty breathing, N/V/D.   Neck has been incredibly stiff and sore. Certain movements are very painful. Trying heating pads, neck massagers, OTC NSAIDs. Does notice some cracking and clicking in the neck. Right shoulder also having the clicking and decreased ROM. No injury or previous issues to explain these pains.   Has been seeing Neurology who treated him last year for Lyme disease with doxycycline which may have helped for a period of time. Recently went back to see Neurologist who prescribed gabapentin for pain control which he declines taking because he wants to know where the pain is coming from.   Relevant past medical, surgical, family and social history reviewed and updated as indicated. Interim medical history since our last visit reviewed. Allergies and medications reviewed and updated.  Review of Systems  Per HPI unless specifically indicated above     Objective:    Temp 98 F (36.7 C) (Temporal)    Ht 5' 10" (1.778 m)    Wt 235 lb (106.6 kg)    BMI 33.72 kg/m   Wt Readings from Last 3 Encounters:  07/04/19 235 lb (106.6 kg)  08/20/18 225 lb 12.8 oz (102.4 kg)  06/05/18 218 lb 1.6 oz (98.9 kg)    Physical Exam Vitals signs and nursing note reviewed.  Constitutional:      General: He is not in acute distress.    Appearance: Normal appearance.  HENT:  Head: Atraumatic.     Right Ear: External ear normal.     Left Ear: External ear normal.     Nose: Nose normal. No congestion.     Mouth/Throat:     Mouth: Mucous membranes are moist.     Pharynx: Oropharynx is clear.  Eyes:     Extraocular Movements: Extraocular movements intact.     Conjunctiva/sclera: Conjunctivae normal.  Neck:     Musculoskeletal: Normal range of motion.     Comments: No obvious adenopathy or thyromegaly from my view on the video visit, but unable to palpate neck given virtual nature of visit Pulmonary:     Effort: Pulmonary effort is normal. No respiratory  distress.  Musculoskeletal:        General: No deformity.     Comments: Decreased ROM in right shoulder with active ROM, unable to extend overhead Limited ability to examine today given virtual nature of visit  Skin:    General: Skin is dry.     Findings: No erythema or rash.  Neurological:     Mental Status: He is oriented to person, place, and time.  Psychiatric:        Mood and Affect: Mood normal.        Thought Content: Thought content normal.        Judgment: Judgment normal.     Results for orders placed or performed during the hospital encounter of 03/05/18  Surgical pathology  Result Value Ref Range   SURGICAL PATHOLOGY      Surgical Pathology CASE: [id number] PATIENT: Antonio Mueller Surgical Pathology Report     SPECIMEN SUBMITTED: A. Inflammatory polyp rectum, rule dysplasia and malignancy; hot snare  CLINICAL HISTORY: None provided  PRE-OPERATIVE DIAGNOSIS: Rectal bleeding K62.5 Grade 1 internal hemorrhoids K64.0  POST-OPERATIVE DIAGNOSIS: Colon mass     DIAGNOSIS: A.  INFLAMMATORY POLYP OF RECTUM; HOT SNARE: - POLYPOID COLONIC MUCOSA WITH EXUDATE, GRANULATION TISSUE, INFLAMMATION OF THE LAMINA PROPRIA, VASCULAR CONGESTION AND HYPERPLASTIC CHANGES. - NEGATIVE FOR DYSPLASIA AND MALIGNANCY.  Note: The differential diagnosis includes mucosal prolapse-type polyp (inflammatory cloacogenic polyp) and retention-type polyp.   GROSS DESCRIPTION:  A. Labeled: hot snare inflammatory polyp rectum to rule out dysplasia and malignancy  Tissue fragment(s): 1  Size: 1.8 x 1.6 x 1.3 cm  Description: in formalin, purple-tan fragment, inked black at the base and section  Enti rely submitted in 1-3 cassette(s).          Final Diagnosis performed by Delorse Lek, MD.  Electronically signed 03/07/2018 2:30:27PM    The electronic signature indicates that the named Attending Pathologist has evaluated the specimen  Technical component performed at  [facility], [address], [place], [place] [address] Lab: [phone] Dir: Rush Farmer, MD, MMM  Professional component performed at [facility], [facility], [place], [place], [place] [address] Lab: [phone] Dir: Dellia Nims. Reuel Derby, MD        Assessment & Plan:   Problem List Items Addressed This Visit    None    Visit Diagnoses    Neck pain    -  Primary   Relevant Orders   DG Cervical Spine Complete (Completed)   Chronic right shoulder pain       Relevant Orders   DG Shoulder Right (Completed)   Neck mass       Relevant Orders   US Soft Tissue Head/Neck   Malaise       Relevant Orders   CBC with Differential/Platelet (Completed)   Comprehensive  metabolic panel (Completed)   TSH (Completed)   VITAMIN D 25 Hydroxy (Vit-D Deficiency, Fractures) (Completed)   B12 (Completed)   Arthralgia, unspecified joint       Relevant Orders   Sed Rate (ESR) (Completed)   Uric acid (Completed)   ANA w/Reflex (Completed)   Rheumatoid Factor (Completed)    Difficult to determine if sxs connected or separate entities, and what may be related to sequela of past Lyme Disease. Will obtain u/s soft tissue neck to evaluate for lymphadenopathy vs lipoma vs thyroid nodules, and x-rays of c spine and right shoulder. His joint sxs sound arthritic, but presented fairly quickly and without obvious trigger such as injury. Will obtain a variety of labs to r/o inflammatory/autoimmune causes and vitamin deficiency. F/u based on results. Declines any medications in meantime.    Follow up plan: Return if symptoms worsen or fail to improve.

## 2019-07-05 ENCOUNTER — Other Ambulatory Visit: Payer: Managed Care, Other (non HMO)

## 2019-07-05 ENCOUNTER — Other Ambulatory Visit: Payer: Self-pay

## 2019-07-05 DIAGNOSIS — R5381 Other malaise: Secondary | ICD-10-CM

## 2019-07-05 DIAGNOSIS — M2550 Pain in unspecified joint: Secondary | ICD-10-CM

## 2019-07-06 ENCOUNTER — Ambulatory Visit
Admission: RE | Admit: 2019-07-06 | Discharge: 2019-07-06 | Disposition: A | Discharge status: Home / Self Care | Payer: Managed Care, Other (non HMO) | Source: Ambulatory Visit | Attending: Family Medicine | Admitting: Family Medicine

## 2019-07-06 DIAGNOSIS — M25511 Pain in right shoulder: Secondary | ICD-10-CM | POA: Insufficient documentation

## 2019-07-06 DIAGNOSIS — G8929 Other chronic pain: Secondary | ICD-10-CM

## 2019-07-06 DIAGNOSIS — M542 Cervicalgia: Secondary | ICD-10-CM

## 2019-07-06 LAB — COMPREHENSIVE METABOLIC PANEL
ALT: 20 IU/L (ref 0–44)
AST: 19 IU/L (ref 0–40)
Albumin/Globulin Ratio: 2 (ref 1.2–2.2)
Albumin: 4.4 g/dL (ref 4.0–5.0)
Alkaline Phosphatase: 62 IU/L (ref 39–117)
BUN/Creatinine Ratio: 12 (ref 9–20)
BUN: 12 mg/dL (ref 6–20)
Bilirubin Total: 0.4 mg/dL (ref 0.0–1.2)
CO2: 22 mmol/L (ref 20–29)
Calcium: 9.6 mg/dL (ref 8.7–10.2)
Chloride: 102 mmol/L (ref 96–106)
Creatinine, Ser: 1 mg/dL (ref 0.76–1.27)
GFR calc Af Amer: 113 mL/min/{1.73_m2} (ref >59)
GFR calc non Af Amer: 98 mL/min/{1.73_m2} (ref >59)
Globulin, Total: 2.2 g/dL (ref 1.5–4.5)
Glucose: 117 mg/dL — ABNORMAL HIGH (ref 65–99)
Potassium: 4.3 mmol/L (ref 3.5–5.2)
Sodium: 141 mmol/L (ref 134–144)
Total Protein: 6.6 g/dL (ref 6.0–8.5)

## 2019-07-06 LAB — CBC WITH DIFFERENTIAL/PLATELET
Basophils Absolute: 0.1 10*3/uL (ref 0.0–0.2)
Basos: 1 %
EOS (ABSOLUTE): 0.3 10*3/uL (ref 0.0–0.4)
Eos: 4 %
Hematocrit: 46.2 % (ref 37.5–51.0)
Hemoglobin: 15.9 g/dL (ref 13.0–17.7)
Immature Grans (Abs): 0 10*3/uL (ref 0.0–0.1)
Immature Granulocytes: 0 %
Lymphocytes Absolute: 2.7 10*3/uL (ref 0.7–3.1)
Lymphs: 32 %
MCH: 29.6 pg (ref 26.6–33.0)
MCHC: 34.4 g/dL (ref 31.5–35.7)
MCV: 86 fL (ref 79–97)
Monocytes Absolute: 0.6 10*3/uL (ref 0.1–0.9)
Monocytes: 7 %
Neutrophils Absolute: 4.7 10*3/uL (ref 1.4–7.0)
Neutrophils: 56 %
Platelets: 256 10*3/uL (ref 150–450)
RBC: 5.37 x10E6/uL (ref 4.14–5.80)
RDW: 12.3 % (ref 11.6–15.4)
WBC: 8.3 10*3/uL (ref 3.4–10.8)

## 2019-07-06 LAB — TSH: TSH: 2.76 u[IU]/mL (ref 0.450–4.500)

## 2019-07-06 LAB — ANA W/REFLEX: Anti Nuclear Antibody (ANA): NEGATIVE

## 2019-07-06 LAB — RHEUMATOID FACTOR: Rhuematoid fact SerPl-aCnc: <10 IU/mL (ref 0.0–13.9)

## 2019-07-06 LAB — VITAMIN B12: Vitamin B-12: 679 pg/mL (ref 232–1245)

## 2019-07-06 LAB — URIC ACID: Uric Acid: 7.5 mg/dL (ref 3.7–8.6)

## 2019-07-06 LAB — SEDIMENTATION RATE: Sed Rate: 6 mm/hr (ref 0–15)

## 2019-07-06 LAB — VITAMIN D 25 HYDROXY (VIT D DEFICIENCY, FRACTURES): Vit D, 25-Hydroxy: 25.8 ng/mL — ABNORMAL LOW (ref 30.0–100.0)

## 2019-07-08 ENCOUNTER — Encounter: Payer: Self-pay | Admitting: Family Medicine

## 2019-07-16 ENCOUNTER — Ambulatory Visit
Admission: RE | Admit: 2019-07-16 | Discharge: 2019-07-16 | Disposition: A | Discharge status: Home / Self Care | Payer: Managed Care, Other (non HMO) | Source: Ambulatory Visit | Attending: Family Medicine | Admitting: Family Medicine

## 2019-07-16 ENCOUNTER — Other Ambulatory Visit: Payer: Self-pay

## 2019-07-16 DIAGNOSIS — R221 Localized swelling, mass and lump, neck: Secondary | ICD-10-CM | POA: Diagnosis not present

## 2019-07-17 ENCOUNTER — Encounter: Payer: Self-pay | Admitting: Family Medicine

## 2019-07-19 ENCOUNTER — Other Ambulatory Visit: Payer: Self-pay | Admitting: Family Medicine

## 2019-07-19 DIAGNOSIS — R490 Dysphonia: Secondary | ICD-10-CM

## 2019-07-22 ENCOUNTER — Encounter: Payer: Self-pay | Admitting: Family Medicine

## 2019-07-24 ENCOUNTER — Ambulatory Visit: Payer: Managed Care, Other (non HMO) | Admitting: Nurse Practitioner

## 2019-07-24 ENCOUNTER — Other Ambulatory Visit: Payer: Self-pay

## 2019-07-24 ENCOUNTER — Encounter: Payer: Self-pay | Admitting: Nurse Practitioner

## 2019-07-24 VITALS — BP 131/84 | HR 80 | Temp 98.4°F | Ht 70.0 in | Wt 236.0 lb

## 2019-07-24 DIAGNOSIS — R079 Chest pain, unspecified: Secondary | ICD-10-CM | POA: Diagnosis not present

## 2019-07-24 MED ORDER — OMEPRAZOLE 20 MG PO CPDR: 20.0000 mg | DELAYED_RELEASE_CAPSULE | Freq: Every day | ORAL | 3 refills | Status: DC

## 2019-07-24 NOTE — Assessment & Plan Note (Signed)
Acute with reported worsening. EKG NSR, however no episode of pain at this time.  Urgent referral to cardiology due to symptom increase in frequency and report palpitations during episodes, may benefit from 24 hour Holter.  Trial of Prilosec sent to pharmacy.  Labs: CMP, CBC, TSH.  Have instructed that if worsening symptoms he is to immediately go to ER, was able to verbalize this back.  Return in 4 weeks for follow-up with PCP.

## 2019-07-24 NOTE — Patient Instructions (Signed)

## 2019-07-24 NOTE — Progress Notes (Signed)
BP 131/84   Pulse 80   Temp 98.4 F (36.9 C) (Oral)   Ht '5\' 10"'  (1.778 m)   Wt 236 lb (107 kg)   SpO2 98%   BMI 33.86 kg/m    Subjective:    Patient ID: Antonio Mueller, male    DOB: 1986/10/04, 33 y.o.   MRN: [id number]  HPI: Antonio Mueller is a 33 y.o. male  Chief Complaint  Patient presents with  . Chest Pain    on and off x over a week   CHEST PAIN: About one year ago was having neurological symptoms (foggy and difficulty) and saw neurologist.  Tested positive for Lyme disease, treated with Doxycycline.  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  States the hoarseness and feeling like something in throat is noted more in morning.  He contacted neurologist, recommended placing him on Gabapentin which he declined.  Constantly exhausted and has joint pain.    1 1/2 week ago started having pressure in chest to left side when he would lay down at night, did not respond to Tums.  Then started creeping up during day with pressure and discomfort.  Does not correlate with meals, has had episodes first thing in morning. Denies increased anxiety or stress.  Denies feeling of elephant on chest or crushing pressure.  No increase in pain with coughing or taking deep breaths.  Feels like events have increased in frequency, initially at night and now during daytime hours as well.  No pain at this time. Nature -- pressure and "piercing" pain Radiation --- sometimes feels like it is in back Other Symptoms presenting with ---- none Lasts how long --- one hour or so, more often later in the day Frequency ---- 1 to 2 times a day  Pain level ---- 2/10 dull and sharper pain 7-8/10 Treatments --- Ladonna Snide (this has helped at times, but is unsure it helped or that it did not start working until episode complete) and  TUMS (does not help), used heating pad on back and chest which did not help Aspirin: no Recurrent headaches: no Visual changes: no Palpitations: yes, occasionally feels like it skips a beat during episodes Dyspnea: no Lower extremity edema: no Dizzy/lightheaded: no  Relevant past medical, surgical, family and social history reviewed and updated as indicated. Interim medical history since our last visit reviewed. Allergies and medications reviewed and updated.  Review of Systems  Constitutional: Negative for activity change, diaphoresis, fatigue and fever.  Respiratory: Negative for cough, chest tightness, shortness of breath and wheezing.   Cardiovascular: Positive for chest pain and palpitations (on occasion with pain). Negative for leg swelling.  Gastrointestinal: Negative for abdominal distention, abdominal pain, constipation, diarrhea, nausea and vomiting.  Endocrine: Negative for cold intolerance and heat intolerance.  Musculoskeletal: Positive for arthralgias (baseline).  Skin: Negative.   Neurological: Negative for dizziness, syncope, weakness, light-headedness, numbness and headaches.  Psychiatric/Behavioral: Negative.     Per HPI unless specifically indicated above     Objective:    BP 131/84   Pulse 80   Temp 98.4 F (36.9 C) (Oral)   Ht '5\' 10"'  (1.778 m)   Wt 236 lb (107 kg)   SpO2 98%   BMI 33.86 kg/m   Wt Readings from Last 3 Encounters:  07/24/19 236  lb (107 kg)  07/04/19 235 lb (106.6 kg)  08/20/18 225 lb 12.8 oz (102.4 kg)    Physical Exam Vitals signs and nursing note reviewed.  Constitutional:      General: He is awake. He is not in acute distress.    Appearance: He is well-developed. He is obese. He is not ill-appearing.  HENT:     Head: Normocephalic and atraumatic.     Right Ear: Hearing normal. No drainage.     Left Ear: Hearing normal. No drainage.     Mouth/Throat:     Pharynx: Uvula midline.  Eyes:     General: Lids are normal.         Right eye: No discharge.        Left eye: No discharge.     Conjunctiva/sclera: Conjunctivae normal.     Pupils: Pupils are equal, round, and reactive to light.  Neck:     Musculoskeletal: Normal range of motion and neck supple.     Thyroid: No thyromegaly.     Vascular: No carotid bruit.  Cardiovascular:     Rate and Rhythm: Normal rate and regular rhythm.     Heart sounds: Normal heart sounds, S1 normal and S2 normal. No murmur. No gallop.   Pulmonary:     Effort: Pulmonary effort is normal. No accessory muscle usage or respiratory distress.     Breath sounds: Normal breath sounds.  Abdominal:     General: Bowel sounds are normal.     Palpations: Abdomen is soft. There is no hepatomegaly or splenomegaly.     Tenderness: There is no abdominal tenderness.  Musculoskeletal: Normal range of motion.     Right lower leg: No edema.     Left lower leg: No edema.  Skin:    General: Skin is warm and dry.     Capillary Refill: Capillary refill takes less than 2 seconds.  Neurological:     Mental Status: He is alert and oriented to person, place, and time.     Deep Tendon Reflexes: Reflexes are normal and symmetric.  Psychiatric:        Attention and Perception: Attention normal.        Mood and Affect: Mood normal.        Speech: Speech normal.        Behavior: Behavior normal. Behavior is cooperative.        Thought Content: Thought content normal.        Judgment: Judgment normal.   EKG WITH NSR at 98.  Normal axis.  No LVH.  Results for orders placed or performed in visit on 07/05/19  Rheumatoid Factor  Result Value Ref Range   Rhuematoid fact SerPl-aCnc <10.0 0.0 - 13.9 IU/mL  ANA w/Reflex  Result Value Ref Range   Anti Nuclear Antibody (ANA) Negative Negative  Uric acid  Result Value Ref Range   Uric Acid 7.5 3.7 - 8.6 mg/dL  Sed Rate (ESR)  Result Value Ref Range   Sed Rate 6 0 - 15 mm/hr  B12  Result Value Ref Range   Vitamin B-12 679 232 - 1,245 pg/mL  VITAMIN D 25  Hydroxy (Vit-D Deficiency, Fractures)  Result Value Ref Range   Vit D, 25-Hydroxy 25.8 (L) 30.0 - 100.0 ng/mL  TSH  Result Value Ref Range   TSH 2.760 0.450 - 4.500 uIU/mL  Comprehensive metabolic panel  Result Value Ref Range   Glucose 117 (H) 65 - 99 mg/dL   BUN 12 6 -  20 mg/dL   Creatinine, Ser 1.00 0.76 - 1.27 mg/dL   GFR calc non Af Amer 98 >59 mL/min/1.73   GFR calc Af Amer 113 >59 mL/min/1.73   BUN/Creatinine Ratio 12 9 - 20   Sodium 141 134 - 144 mmol/L   Potassium 4.3 3.5 - 5.2 mmol/L   Chloride 102 96 - 106 mmol/L   CO2 22 20 - 29 mmol/L   Calcium 9.6 8.7 - 10.2 mg/dL   Total Protein 6.6 6.0 - 8.5 g/dL   Albumin 4.4 4.0 - 5.0 g/dL   Globulin, Total 2.2 1.5 - 4.5 g/dL   Albumin/Globulin Ratio 2.0 1.2 - 2.2   Bilirubin Total 0.4 0.0 - 1.2 mg/dL   Alkaline Phosphatase 62 39 - 117 IU/L   AST 19 0 - 40 IU/L   ALT 20 0 - 44 IU/L  CBC with Differential/Platelet  Result Value Ref Range   WBC 8.3 3.4 - 10.8 x10E3/uL   RBC 5.37 4.14 - 5.80 x10E6/uL   Hemoglobin 15.9 13.0 - 17.7 g/dL   Hematocrit 46.2 37.5 - 51.0 %   MCV 86 79 - 97 fL   MCH 29.6 26.6 - 33.0 pg   MCHC 34.4 31.5 - 35.7 g/dL   RDW 12.3 11.6 - 15.4 %   Platelets 256 150 - 450 x10E3/uL   Neutrophils 56 Not Estab. %   Lymphs 32 Not Estab. %   Monocytes 7 Not Estab. %   Eos 4 Not Estab. %   Basos 1 Not Estab. %   Neutrophils Absolute 4.7 1.4 - 7.0 x10E3/uL   Lymphocytes Absolute 2.7 0.7 - 3.1 x10E3/uL   Monocytes Absolute 0.6 0.1 - 0.9 x10E3/uL   EOS (ABSOLUTE) 0.3 0.0 - 0.4 x10E3/uL   Basophils Absolute 0.1 0.0 - 0.2 x10E3/uL   Immature Granulocytes 0 Not Estab. %   Immature Grans (Abs) 0.0 0.0 - 0.1 x10E3/uL      Assessment & Plan:   Problem List Items Addressed This Visit      Other   Chest pain - Primary    Acute with reported worsening. EKG NSR, however no episode of pain at this time.  Urgent referral to cardiology due to symptom increase in frequency and report palpitations during episodes,  may benefit from 24 hour Holter.  Trial of Prilosec sent to pharmacy.  Labs: CMP, CBC, TSH.  Have instructed that if worsening symptoms he is to immediately go to ER, was able to verbalize this back.  Return in 4 weeks for follow-up with PCP.      Relevant Orders   EKG 12-Lead (Completed)   Ambulatory referral to Cardiology   CBC with Differential/Platelet   Comprehensive metabolic panel   TSH       Follow up plan: Return in about 4 weeks (around 08/21/2019) for Hoarseness follow-up with PCP.

## 2019-07-25 LAB — COMPREHENSIVE METABOLIC PANEL
ALT: 22 IU/L (ref 0–44)
AST: 23 IU/L (ref 0–40)
Albumin/Globulin Ratio: 2.2 (ref 1.2–2.2)
Albumin: 4.9 g/dL (ref 4.0–5.0)
Alkaline Phosphatase: 60 IU/L (ref 39–117)
BUN/Creatinine Ratio: 9 (ref 9–20)
BUN: 10 mg/dL (ref 6–20)
Bilirubin Total: 0.9 mg/dL (ref 0.0–1.2)
CO2: 24 mmol/L (ref 20–29)
Calcium: 10.2 mg/dL (ref 8.7–10.2)
Chloride: 99 mmol/L (ref 96–106)
Creatinine, Ser: 1.06 mg/dL (ref 0.76–1.27)
GFR calc Af Amer: 105 mL/min/{1.73_m2} (ref >59)
GFR calc non Af Amer: 91 mL/min/{1.73_m2} (ref >59)
Globulin, Total: 2.2 g/dL (ref 1.5–4.5)
Glucose: 91 mg/dL (ref 65–99)
Potassium: 4.9 mmol/L (ref 3.5–5.2)
Sodium: 140 mmol/L (ref 134–144)
Total Protein: 7.1 g/dL (ref 6.0–8.5)

## 2019-07-25 LAB — CBC WITH DIFFERENTIAL/PLATELET
Basophils Absolute: 0.1 10*3/uL (ref 0.0–0.2)
Basos: 1 %
EOS (ABSOLUTE): 0.2 10*3/uL (ref 0.0–0.4)
Eos: 4 %
Hematocrit: 51.9 % — ABNORMAL HIGH (ref 37.5–51.0)
Hemoglobin: 17.7 g/dL (ref 13.0–17.7)
Immature Grans (Abs): 0 10*3/uL (ref 0.0–0.1)
Immature Granulocytes: 0 %
Lymphocytes Absolute: 1.8 10*3/uL (ref 0.7–3.1)
Lymphs: 31 %
MCH: 30.1 pg (ref 26.6–33.0)
MCHC: 34.1 g/dL (ref 31.5–35.7)
MCV: 88 fL (ref 79–97)
Monocytes Absolute: 0.5 10*3/uL (ref 0.1–0.9)
Monocytes: 8 %
Neutrophils Absolute: 3.5 10*3/uL (ref 1.4–7.0)
Neutrophils: 56 %
Platelets: 271 10*3/uL (ref 150–450)
RBC: 5.88 x10E6/uL — ABNORMAL HIGH (ref 4.14–5.80)
RDW: 12.3 % (ref 11.6–15.4)
WBC: 6 10*3/uL (ref 3.4–10.8)

## 2019-07-25 LAB — TSH: TSH: 3.2 u[IU]/mL (ref 0.450–4.500)

## 2019-08-08 DIAGNOSIS — M791 Myalgia, unspecified site: Secondary | ICD-10-CM | POA: Insufficient documentation

## 2019-08-08 DIAGNOSIS — G8929 Other chronic pain: Secondary | ICD-10-CM | POA: Insufficient documentation

## 2019-08-14 ENCOUNTER — Other Ambulatory Visit: Payer: Self-pay | Admitting: Internal Medicine

## 2019-08-14 DIAGNOSIS — G8929 Other chronic pain: Secondary | ICD-10-CM

## 2019-08-14 DIAGNOSIS — M25511 Pain in right shoulder: Secondary | ICD-10-CM

## 2019-08-22 ENCOUNTER — Ambulatory Visit: Payer: Managed Care, Other (non HMO) | Admitting: Family Medicine

## 2019-08-27 ENCOUNTER — Other Ambulatory Visit: Payer: Self-pay

## 2019-08-27 ENCOUNTER — Ambulatory Visit
Admission: RE | Admit: 2019-08-27 | Discharge: 2019-08-27 | Disposition: A | Discharge status: Home / Self Care | Payer: Managed Care, Other (non HMO) | Source: Ambulatory Visit | Attending: Internal Medicine | Admitting: Internal Medicine

## 2019-08-27 DIAGNOSIS — G8929 Other chronic pain: Secondary | ICD-10-CM

## 2019-08-27 DIAGNOSIS — M25511 Pain in right shoulder: Secondary | ICD-10-CM | POA: Insufficient documentation

## 2019-10-17 ENCOUNTER — Encounter: Payer: Self-pay | Admitting: Family Medicine

## 2019-10-17 DIAGNOSIS — E559 Vitamin D deficiency, unspecified: Secondary | ICD-10-CM | POA: Insufficient documentation

## 2019-10-24 ENCOUNTER — Other Ambulatory Visit: Payer: Self-pay

## 2019-10-24 ENCOUNTER — Encounter: Payer: Self-pay | Admitting: Gastroenterology

## 2019-10-24 ENCOUNTER — Ambulatory Visit: Payer: Managed Care, Other (non HMO) | Admitting: Gastroenterology

## 2019-10-24 VITALS — BP 129/89 | HR 109 | Temp 98.3°F | Resp 18 | Ht 70.0 in | Wt 248.6 lb

## 2019-10-24 DIAGNOSIS — K625 Hemorrhage of anus and rectum: Secondary | ICD-10-CM | POA: Diagnosis not present

## 2019-10-24 NOTE — Progress Notes (Signed)
Cephas Darby, MD [address]  [place]  [place], [place] [address]  Main: [phone]  Fax: [phone]    Gastroenterology Consultation  Referring Provider:     Volney American,* Primary Care Physician:  Volney American, PA-C Primary Gastroenterologist:  Dr. Cephas Darby Reason for Consultation:     Rectal bleeding        HPI:   Antonio Mueller is a 33 y.o.caucasian male referred by Volney American, PA-C  for consultation & management of rectal bleeding. Patient has been on keto diet since June 2018 and has been doing very well. For the last 2 weeks, he noticed mild left lower quadrant discomfort, episodes of rectal bleeding associated with pain radiating to lower back. He denies any diarrhea or constipation. He noticed blood on wiping as well as an surface of the stool. It lasted for 2 weeks that prompted him to be referred to GI. His CBC was normal. He reports good appetite, denies diarrhea or constipation, nausea, vomiting, fever, chills. Currently, he denies any rectal bleeding.  Workup in [place] in 2017 included stool studies negative for ova parasites, stool cultures negative, fecal Protectin normal, fecal lactoferrin negative, percentage fat normal, occult blood negative, Shiga toxin negative. Hemoglobin normal, ttg IgA normal, his total IgA normal, cRP normal, LFTs normal, TSH normal Colonoscopy normal including biopsies and terminal ileum.  Follow-up visit 10/24/2019 Antonio Mueller is here to discuss about rectal bleeding that occurred on Tuesday this week.  Apparently, he was treated for pericarditis in August when he was evaluated by cardiology for 1 week history of chest pain.  He was given colchicine.  He developed severe diarrhea which was the side effect of colchicine.  Diarrhea resolved after he finished the course of colchicine.  Later, he developed severe constipation about 2 weeks ago.  On Tuesday this week, he had hard bowel movement  and noticed traces of blood on the surface of initial stool.  He took pictures of his BM and shared it with me today.  He did not have any further episodes of rectal bleeding since then.  He reports that his bowel movements are normally formed other than hard.  He denies any other GI symptoms.  Given his history of inflammatory polyp in the past that was removed from his rectum in 2019, he wanted to make sure if he needed any endoscopic evaluation.  He gained almost 10 pounds within last 1 year in the midst of pandemic as he stopped going to the gym.  He does not have any other GI concerns today  NSAIDs: none  Antiplts/Anticoagulants/Anti thrombotics: none He denies family history of colon cancer in first-degree relatives or second-degree relatives Cousin with Crohn's disease  GI Procedures:  Colonoscopy at [facility], 12/13/2016 The colon mucosa appeared normal in the terminal ileum and throughout the entire examined colon. Multiple random biopsies were performed. A 7 mm semi-pedunculated polyp was found in the sigmoid colon, polypectomy was performed with the cold snare.retroflexion in the rectum was normal. Random biopsies in the colon showed normal mucosa with no diagnostic abnormalities. Sigmoid colon Polyp inflammatory pseudopolyp  Colonoscopy 03/05/2018 - One 30 mm polyp in the rectum at 20 cm proximal to the anus, removed with a hot snare. Resected and retrieved. Tattooed. Source of rectal bleeding - The distal rectum and anal verge are normal on retroflexion view. - Normal mucosa in the entire examined colon. - The examined portion of the ileum was normal.  DIAGNOSIS:  A. INFLAMMATORY POLYP OF RECTUM; HOT SNARE:  - POLYPOID COLONIC MUCOSA WITH EXUDATE, GRANULATION TISSUE, INFLAMMATION  OF THE LAMINA PROPRIA, VASCULAR CONGESTION AND HYPERPLASTIC CHANGES.  - NEGATIVE FOR DYSPLASIA AND MALIGNANCY.   Past Medical History:  Diagnosis Date  . Colon polyp   .  Inflammatory bowel diseases (IBD)   . Lichen striatus   . Lyme disease   . Migraine headache     Past Surgical History:  Procedure Laterality Date  . COLONOSCOPY  03/05/2018   Procedure: COLONOSCOPY;  Surgeon: Lin Landsman, MD;  Location: [facility] ENDOSCOPY;  Service: Gastroenterology;;  . COLONOSCOPY W/ POLYPECTOMY    . FLEXIBLE SIGMOIDOSCOPY    . HERNIA REPAIR     1 as a child    Current Outpatient Medications:  .  bifidobacterium infantis (ALIGN) capsule, Take 1 capsule by mouth daily., Disp: , Rfl:  .  Multiple Vitamins-Minerals (MULTIVITAMIN WITH MINERALS) tablet, Take 1 tablet by mouth daily., Disp: , Rfl:  .  Probiotic Product (DIGESTIVE ADVANTAGE PO), Take by mouth., Disp: , Rfl:  .  VITAMIN D PO, Take by mouth daily., Disp: , Rfl:  .  colchicine 0.6 MG tablet, Take 0.6 mg by mouth 2 (two) times daily., Disp: , Rfl:  .  cyclobenzaprine (FLEXERIL) 5 MG tablet, TK 1 T PO HS, Disp: , Rfl:  .  FIBER PO, Take 5 capsules by mouth 3 (three) times daily., Disp: , Rfl:  .  omeprazole (PRILOSEC) 20 MG capsule, Take 1 capsule (20 mg total) by mouth daily. (Patient not taking: Reported on 10/24/2019), Disp: 30 capsule, Rfl: 3 .  omeprazole (PRILOSEC) 40 MG capsule, Take 40 mg by mouth daily., Disp: , Rfl:     Family History  Problem Relation Age of Onset  . Thyroid disease Mother   . Cancer Mother        Basal Cell Carinoma  . Hepatitis C Father   . Arthritis Father        Rheumatoid  . Cancer Maternal Grandmother        Breast  . Heart disease Maternal Grandfather   . Alzheimer's disease Maternal Grandfather   . Alzheimer's disease Paternal Grandmother   . Heart disease Paternal Grandfather   . Congestive Heart Failure Paternal Grandfather   . Parkinson's disease Paternal Grandfather   . Crohn's disease Cousin   . Cancer Other        Colon Cancer  . Cancer Other        Colon     Social History   Tobacco Use  . Smoking status: Never Smoker  . Smokeless tobacco:  Never Used  Substance Use Topics  . Alcohol use: Not Currently  . Drug use: No    Allergies as of 10/24/2019 - Review Complete 10/24/2019  Allergen Reaction Noted  . Propofol  08/08/2018  . Nickel Rash 06/05/2018    Review of Systems:    All systems reviewed and negative except where noted in HPI.   Physical Exam:  BP 129/89 (BP Location: Left Arm, Patient Position: Sitting, Cuff Size: Large)   Pulse (!) 109   Temp 98.3 F (36.8 C)   Resp 18   Ht 5\' 10"  (1.778 m)   Wt 248 lb 9.6 oz (112.8 kg)   BMI 35.67 kg/m  No LMP for male patient.  General:   Alert,  Well-developed, well-nourished, pleasant and cooperative in NAD Head:  Normocephalic and atraumatic. Eyes:  Sclera clear, no icterus.   Conjunctiva pink. Ears:  Normal auditory acuity. Nose:  No deformity, discharge, or lesions. Mouth:  No deformity or lesions,oropharynx pink & moist. Neck:  Supple; no masses or thyromegaly. Lungs:  Respirations even and unlabored.  Clear throughout to auscultation.   No wheezes, crackles, or rhonchi. No acute distress. Heart:  Regular rate and rhythm; no murmurs, clicks, rubs, or gallops. Abdomen:  Normal bowel sounds. Soft, non-tender and non-distended without masses, hepatosplenomegaly or hernias noted.  No guarding or rebound tenderness.   Rectal: Not performed Msk:  Symmetrical without gross deformities. Good, equal movement & strength bilaterally. Pulses:  Normal pulses noted. Extremities:  No clubbing or edema.  No cyanosis. Neurologic:  Alert and oriented x3;  grossly normal neurologically. Skin:  Intact without significant lesions or rashes. No jaundice. Psych:  Alert and cooperative. Normal mood and affect.  Imaging Studies: none  Assessment and Plan:   Antonio Mueller is a 33 y.o. Caucasian male with BMI 35, previous history of rectal bleeding secondary to inflammatory polyp in the rectum that was resected in 2019, seen for follow-up of isolated episode of painless rectal  bleeding.  Recent history of pericarditis, treated with colchicine resulted in diarrhea as a medication side effect followed by constipation.  Patient had a colonoscopy in 2019 which revealed inflammatory polyp in the rectum that was resected.  I do not think he needs another endoscopic evaluation.  Rectal bleeding is probably secondary to irritation of the hemorrhoids.  Therefore, I have advised patient that if he continues to have recurrence of rectal bleeding, he can contact me back and we can schedule flexible sigmoidoscopy Patient felt reassured     Follow up as needed   Cephas Darby, MD

## 2019-11-27 ENCOUNTER — Encounter: Payer: Self-pay | Admitting: Family Medicine

## 2019-11-27 ENCOUNTER — Other Ambulatory Visit: Payer: Self-pay

## 2019-11-27 ENCOUNTER — Ambulatory Visit (INDEPENDENT_AMBULATORY_CARE_PROVIDER_SITE_OTHER): Payer: Managed Care, Other (non HMO) | Admitting: Family Medicine

## 2019-11-27 VITALS — BP 128/88 | HR 70 | Temp 97.8°F | Ht 69.3 in | Wt 241.0 lb

## 2019-11-27 DIAGNOSIS — E559 Vitamin D deficiency, unspecified: Secondary | ICD-10-CM | POA: Diagnosis not present

## 2019-11-27 DIAGNOSIS — Z0000 Encounter for general adult medical examination without abnormal findings: Secondary | ICD-10-CM | POA: Diagnosis not present

## 2019-11-27 NOTE — Progress Notes (Signed)
BP 128/88   Pulse 70   Temp 97.8 F (36.6 C) (Oral)   Ht 5' 9.3" (1.76 m)   Wt 241 lb (109.3 kg)   SpO2 99%   BMI 35.28 kg/m    Subjective:    Patient ID: Antonio Mueller, male    DOB: 03-13-1986, 33 y.o.   MRN: [id number]  HPI: Antonio Mueller is a 33 y.o. male presenting on 11/27/2019 for comprehensive medical examination. Current medical complaints include:none  He currently lives with: Interim Problems from his last visit: no  Depression Screen done today and results listed below:  Depression screen [facility] 2/9 11/27/2019 08/20/2018 02/14/2018  Decreased Interest 0 0 0  Down, Depressed, Hopeless 0 0 0  PHQ - 2 Score 0 0 0  Altered sleeping 0 - -  Tired, decreased energy 1 - -  Change in appetite 0 - -  Feeling bad or failure about yourself  0 - -  Trouble concentrating 0 - -  Moving slowly or fidgety/restless 0 - -  Suicidal thoughts 0 - -  PHQ-9 Score 1 - -    The patient does not have a history of falls. I did complete a risk assessment for falls. A plan of care for falls was documented.   Past Medical History:  Past Medical History:  Diagnosis Date  . Colon polyp   . Inflammatory bowel diseases (IBD)   . Lichen striatus   . Lyme disease   . Migraine headache     Surgical History:  Past Surgical History:  Procedure Laterality Date  . COLONOSCOPY  03/05/2018   Procedure: COLONOSCOPY;  Surgeon: Lin Landsman, MD;  Location: [facility] ENDOSCOPY;  Service: Gastroenterology;;  . COLONOSCOPY W/ POLYPECTOMY    . FLEXIBLE SIGMOIDOSCOPY    . HERNIA REPAIR     1 as a child    Medications:  Current Outpatient Medications on File Prior to Visit  Medication Sig  . bifidobacterium infantis (ALIGN) capsule Take 1 capsule by mouth daily.  [name] Kitchen FIBER PO Take 5 capsules by mouth 3 (three) times daily.  . Multiple Vitamins-Minerals (MULTIVITAMIN WITH MINERALS) tablet Take 1 tablet by mouth daily.  . Probiotic Product (DIGESTIVE ADVANTAGE PO) Take by mouth.  [name] Kitchen VITAMIN D PO  Take by mouth daily.   No current facility-administered medications on file prior to visit.    Allergies:  Allergies  Allergen Reactions  . Propofol     Other reaction(s): Other (See Comments) Memory problems  . Nickel Rash    Social History:  Social History   Socioeconomic History  . Marital status: Married    Spouse name: Not on file  . Number of children: Not on file  . Years of education: Not on file  . Highest education level: Not on file  Occupational History  . Not on file  Tobacco Use  . Smoking status: Never Smoker  . Smokeless tobacco: Never Used  Substance and Sexual Activity  . Alcohol use: Not Currently  . Drug use: No  . Sexual activity: Not on file  Other Topics Concern  . Not on file  Social History Narrative  . Not on file   Social Determinants of Health   Financial Resource Strain:   . Difficulty of Paying Living Expenses: Not on file  Food Insecurity:   . Worried About Charity fundraiser in the Last Year: Not on file  . Ran Out of Food in the Last Year: Not on file  Transportation Needs:   .  Lack of Transportation (Medical): Not on file  . Lack of Transportation (Non-Medical): Not on file  Physical Activity:   . Days of Exercise per Week: Not on file  . Minutes of Exercise per Session: Not on file  Stress:   . Feeling of Stress : Not on file  Social Connections:   . Frequency of Communication with Friends and Family: Not on file  . Frequency of Social Gatherings with Friends and Family: Not on file  . Attends Religious Services: Not on file  . Active Member of Clubs or Organizations: Not on file  . Attends Archivist Meetings: Not on file  . Marital Status: Not on file  Intimate Partner Violence:   . Fear of Current or Ex-Partner: Not on file  . Emotionally Abused: Not on file  . Physically Abused: Not on file  . Sexually Abused: Not on file   Social History   Tobacco Use  Smoking Status Never Smoker  Smokeless Tobacco  Never Used   Social History   Substance and Sexual Activity  Alcohol Use Not Currently    Family History:  Family History  Problem Relation Age of Onset  . Thyroid disease Mother   . Cancer Mother        Basal Cell Carinoma  . Hepatitis C Father   . Arthritis Father        Rheumatoid  . Cancer Maternal Grandmother        Breast  . Heart disease Maternal Grandfather   . Alzheimer's disease Maternal Grandfather   . Alzheimer's disease Paternal Grandmother   . Heart disease Paternal Grandfather   . Congestive Heart Failure Paternal Grandfather   . Parkinson's disease Paternal Grandfather   . Crohn's disease Cousin   . Cancer Other        Colon Cancer  . Cancer Other        Colon    Past medical history, surgical history, medications, allergies, family history and social history reviewed with patient today and changes made to appropriate areas of the chart.   Review of Systems - General ROS: negative Psychological ROS: negative Ophthalmic ROS: negative ENT ROS: negative Allergy and Immunology ROS: negative Hematological and Lymphatic ROS: negative Endocrine ROS: negative Breast ROS: negative for breast lumps Respiratory ROS: no cough, shortness of breath, or wheezing Cardiovascular ROS: no chest pain or dyspnea on exertion Gastrointestinal ROS: no abdominal pain, change in bowel habits, or black or bloody stools Genito-Urinary ROS: no dysuria, trouble voiding, or hematuria Musculoskeletal ROS: negative Neurological ROS: no TIA or stroke symptoms Dermatological ROS: negative All other ROS negative except what is listed above and in the HPI.      Objective:    BP 128/88   Pulse 70   Temp 97.8 F (36.6 C) (Oral)   Ht 5' 9.3" (1.76 m)   Wt 241 lb (109.3 kg)   SpO2 99%   BMI 35.28 kg/m   Wt Readings from Last 3 Encounters:  11/27/19 241 lb (109.3 kg)  10/24/19 248 lb 9.6 oz (112.8 kg)  07/24/19 236 lb (107 kg)    Physical Exam Vitals and nursing note  reviewed.  Constitutional:      General: He is not in acute distress.    Appearance: He is well-developed.  HENT:     Head: Atraumatic.     Right Ear: Tympanic membrane and external ear normal.     Left Ear: Tympanic membrane and external ear normal.     Nose:  Nose normal.     Mouth/Throat:     Mouth: Mucous membranes are moist.     Pharynx: Oropharynx is clear.  Eyes:     General: No scleral icterus.    Conjunctiva/sclera: Conjunctivae normal.     Pupils: Pupils are equal, round, and reactive to light.  Cardiovascular:     Rate and Rhythm: Normal rate and regular rhythm.     Heart sounds: Normal heart sounds. No murmur.  Pulmonary:     Effort: Pulmonary effort is normal. No respiratory distress.     Breath sounds: Normal breath sounds.  Abdominal:     General: Bowel sounds are normal. There is no distension.     Palpations: Abdomen is soft. There is no mass.     Tenderness: There is no abdominal tenderness. There is no guarding.  Genitourinary:    Comments: GU exam declined Musculoskeletal:        General: No tenderness. Normal range of motion.     Cervical back: Normal range of motion and neck supple.  Skin:    General: Skin is warm and dry.     Findings: No rash.  Neurological:     General: No focal deficit present.     Mental Status: He is alert and oriented to person, place, and time.     Deep Tendon Reflexes: Reflexes are normal and symmetric.  Psychiatric:        Mood and Affect: Mood normal.        Behavior: Behavior normal.        Thought Content: Thought content normal.        Judgment: Judgment normal.     Results for orders placed or performed in visit on 07/24/19  CBC with Differential/Platelet  Result Value Ref Range   WBC 6.0 3.4 - 10.8 x10E3/uL   RBC 5.88 (H) 4.14 - 5.80 x10E6/uL   Hemoglobin 17.7 13.0 - 17.7 g/dL   Hematocrit 51.9 (H) 37.5 - 51.0 %   MCV 88 79 - 97 fL   MCH 30.1 26.6 - 33.0 pg   MCHC 34.1 31.5 - 35.7 g/dL   RDW 12.3 11.6 - 15.4  %   Platelets 271 150 - 450 x10E3/uL   Neutrophils 56 Not Estab. %   Lymphs 31 Not Estab. %   Monocytes 8 Not Estab. %   Eos 4 Not Estab. %   Basos 1 Not Estab. %   Neutrophils Absolute 3.5 1.4 - 7.0 x10E3/uL   Lymphocytes Absolute 1.8 0.7 - 3.1 x10E3/uL   Monocytes Absolute 0.5 0.1 - 0.9 x10E3/uL   EOS (ABSOLUTE) 0.2 0.0 - 0.4 x10E3/uL   Basophils Absolute 0.1 0.0 - 0.2 x10E3/uL   Immature Granulocytes 0 Not Estab. %   Immature Grans (Abs) 0.0 0.0 - 0.1 x10E3/uL  Comprehensive metabolic panel  Result Value Ref Range   Glucose 91 65 - 99 mg/dL   BUN 10 6 - 20 mg/dL   Creatinine, Ser 1.06 0.76 - 1.27 mg/dL   GFR calc non Af Amer 91 >59 mL/min/1.73   GFR calc Af Amer 105 >59 mL/min/1.73   BUN/Creatinine Ratio 9 9 - 20   Sodium 140 134 - 144 mmol/L   Potassium 4.9 3.5 - 5.2 mmol/L   Chloride 99 96 - 106 mmol/L   CO2 24 20 - 29 mmol/L   Calcium 10.2 8.7 - 10.2 mg/dL   Total Protein 7.1 6.0 - 8.5 g/dL   Albumin 4.9 4.0 - 5.0 g/dL   Globulin, Total  2.2 1.5 - 4.5 g/dL   Albumin/Globulin Ratio 2.2 1.2 - 2.2   Bilirubin Total 0.9 0.0 - 1.2 mg/dL   Alkaline Phosphatase 60 39 - 117 IU/L   AST 23 0 - 40 IU/L   ALT 22 0 - 44 IU/L  TSH  Result Value Ref Range   TSH 3.200 0.450 - 4.500 uIU/mL      Assessment & Plan:   Problem List Items Addressed This Visit      Other   Vitamin D deficiency   Relevant Orders   Vitamin D (25 hydroxy)    Other Visit Diagnoses    Annual physical exam    -  Primary   Relevant Orders   CBC with Differential/Platelet out   Comprehensive metabolic panel   Lipid Panel w/o Chol/HDL Ratio out   TSH   UA/M w/rflx Culture, Routine       Discussed aspirin prophylaxis for myocardial infarction prevention and decision was it was not indicated  LABORATORY TESTING:  Health maintenance labs ordered today as discussed above.   The natural history of prostate cancer and ongoing controversy regarding screening and potential treatment outcomes of prostate  cancer has been discussed with the patient. The meaning of a false positive PSA and a false negative PSA has been discussed. He indicates understanding of the limitations of this screening test and wishes not to proceed with screening PSA testing.   IMMUNIZATIONS:   - Tdap: Tetanus vaccination status reviewed: last tetanus booster within 10 years. - Influenza: Refused  PATIENT COUNSELING:    Sexuality: Discussed sexually transmitted diseases, partner selection, use of condoms, avoidance of unintended pregnancy  and contraceptive alternatives.   Advised to avoid cigarette smoking.  I discussed with the patient that most people either abstain from alcohol or drink within safe limits (<=14/week and <=4 drinks/occasion for males, <=7/weeks and <= 3 drinks/occasion for females) and that the risk for alcohol disorders and other health effects rises proportionally with the number of drinks per week and how often a drinker exceeds daily limits.  Discussed cessation/primary prevention of drug use and availability of treatment for abuse.   Diet: Encouraged to adjust caloric intake to maintain  or achieve ideal body weight, to reduce intake of dietary saturated fat and total fat, to limit sodium intake by avoiding high sodium foods and not adding table salt, and to maintain adequate dietary potassium and calcium preferably from fresh fruits, vegetables, and low-fat dairy products.    stressed the importance of regular exercise  Injury prevention: Discussed safety belts, safety helmets, smoke detector, smoking near bedding or upholstery.   Dental health: Discussed importance of regular tooth brushing, flossing, and dental visits.   Follow up plan: NEXT PREVENTATIVE PHYSICAL DUE IN 1 YEAR. Return in about 1 year (around 11/26/2020) for CPE.

## 2019-11-28 LAB — COMPREHENSIVE METABOLIC PANEL
ALT: 24 IU/L (ref 0–44)
AST: 20 IU/L (ref 0–40)
Albumin/Globulin Ratio: 2.1 (ref 1.2–2.2)
Albumin: 4.7 g/dL (ref 4.0–5.0)
Alkaline Phosphatase: 58 IU/L (ref 39–117)
BUN/Creatinine Ratio: 14 (ref 9–20)
BUN: 11 mg/dL (ref 6–20)
Bilirubin Total: 0.8 mg/dL (ref 0.0–1.2)
CO2: 22 mmol/L (ref 20–29)
Calcium: 9.6 mg/dL (ref 8.7–10.2)
Chloride: 96 mmol/L (ref 96–106)
Creatinine, Ser: 0.8 mg/dL (ref 0.76–1.27)
GFR calc Af Amer: 135 mL/min/{1.73_m2} (ref >59)
GFR calc non Af Amer: 117 mL/min/{1.73_m2} (ref >59)
Globulin, Total: 2.2 g/dL (ref 1.5–4.5)
Glucose: 87 mg/dL (ref 65–99)
Potassium: 4.3 mmol/L (ref 3.5–5.2)
Sodium: 136 mmol/L (ref 134–144)
Total Protein: 6.9 g/dL (ref 6.0–8.5)

## 2019-11-28 LAB — CBC WITH DIFFERENTIAL/PLATELET
Basophils Absolute: 0 10*3/uL (ref 0.0–0.2)
Basos: 1 %
EOS (ABSOLUTE): 0.2 10*3/uL (ref 0.0–0.4)
Eos: 2 %
Hematocrit: 47.5 % (ref 37.5–51.0)
Hemoglobin: 16.7 g/dL (ref 13.0–17.7)
Immature Grans (Abs): 0 10*3/uL (ref 0.0–0.1)
Immature Granulocytes: 0 %
Lymphocytes Absolute: 2 10*3/uL (ref 0.7–3.1)
Lymphs: 28 %
MCH: 28.9 pg (ref 26.6–33.0)
MCHC: 35.2 g/dL (ref 31.5–35.7)
MCV: 82 fL (ref 79–97)
Monocytes Absolute: 0.6 10*3/uL (ref 0.1–0.9)
Monocytes: 8 %
Neutrophils Absolute: 4.4 10*3/uL (ref 1.4–7.0)
Neutrophils: 61 %
Platelets: 283 10*3/uL (ref 150–450)
RBC: 5.77 x10E6/uL (ref 4.14–5.80)
RDW: 12.5 % (ref 11.6–15.4)
WBC: 7.2 10*3/uL (ref 3.4–10.8)

## 2019-11-28 LAB — UA/M W/RFLX CULTURE, ROUTINE
Bilirubin, UA: NEGATIVE
Glucose, UA: NEGATIVE
Leukocytes,UA: NEGATIVE
Nitrite, UA: NEGATIVE
Protein,UA: NEGATIVE
Specific Gravity, UA: 1.02 (ref 1.005–1.030)
Urobilinogen, Ur: 0.2 mg/dL (ref 0.2–1.0)
pH, UA: 6 (ref 5.0–7.5)

## 2019-11-28 LAB — TSH: TSH: 2.9 u[IU]/mL (ref 0.450–4.500)

## 2019-11-28 LAB — MICROSCOPIC EXAMINATION
Bacteria, UA: NONE SEEN
WBC, UA: NONE SEEN /hpf (ref 0–5)

## 2019-11-28 LAB — LIPID PANEL W/O CHOL/HDL RATIO
Cholesterol, Total: 201 mg/dL — ABNORMAL HIGH (ref 100–199)
HDL: 42 mg/dL (ref >39)
LDL Chol Calc (NIH): 144 mg/dL — ABNORMAL HIGH (ref 0–99)
Triglycerides: 84 mg/dL (ref 0–149)
VLDL Cholesterol Cal: 15 mg/dL (ref 5–40)

## 2019-11-28 LAB — VITAMIN D 25 HYDROXY (VIT D DEFICIENCY, FRACTURES): Vit D, 25-Hydroxy: 55 ng/mL (ref 30.0–100.0)

## 2020-02-28 ENCOUNTER — Ambulatory Visit: Payer: Managed Care, Other (non HMO) | Attending: Internal Medicine

## 2020-02-28 DIAGNOSIS — Z23 Encounter for immunization: Secondary | ICD-10-CM

## 2020-02-28 NOTE — Progress Notes (Signed)
   Covid-19 Vaccination Clinic  Name:  Antonio Mueller    MRN: [id number] DOB: 10-06-1986  02/28/2020  Antonio Mueller was observed post Covid-19 immunization for 15 minutes without incident. He was provided with Vaccine Information Sheet and instruction to access the V-Safe system.   Antonio Mueller was instructed to call 911 with any severe reactions post vaccine: [name] Kitchen Difficulty breathing  . Swelling of face and throat  . A fast heartbeat  . A bad rash all over body  . Dizziness and weakness   Immunizations Administered    Name Date Dose VIS Date Route   Pfizer COVID-19 Vaccine 02/28/2020  4:07 PM 0.3 mL 11/15/2019 Intramuscular   Manufacturer: [place]   Lot: [id number]   [place]: [id number]

## 2020-03-24 ENCOUNTER — Ambulatory Visit: Payer: Managed Care, Other (non HMO) | Attending: Internal Medicine

## 2020-03-24 DIAGNOSIS — Z23 Encounter for immunization: Secondary | ICD-10-CM

## 2020-03-24 NOTE — Progress Notes (Signed)
   Covid-19 Vaccination Clinic  Name:  Antonio Mueller    MRN: [id number] DOB: Oct 28, 1986  03/24/2020  Antonio Mueller was observed post Covid-19 immunization for 15 minutes without incident. He was provided with Vaccine Information Sheet and instruction to access the V-Safe system.   Antonio Mueller was instructed to call 911 with any severe reactions post vaccine: [name] Kitchen Difficulty breathing  . Swelling of face and throat  . A fast heartbeat  . A bad rash all over body  . Dizziness and weakness   Immunizations Administered    Name Date Dose VIS Date Route   Pfizer COVID-19 Vaccine 03/24/2020  4:31 PM 0.3 mL 01/29/2019 Intramuscular   Manufacturer: [place]   Lot: [id number]   [place]: [id number]

## 2020-05-21 ENCOUNTER — Encounter: Payer: Self-pay | Admitting: Family Medicine

## 2020-05-21 ENCOUNTER — Ambulatory Visit: Payer: Managed Care, Other (non HMO) | Admitting: Family Medicine

## 2020-05-21 ENCOUNTER — Other Ambulatory Visit: Payer: Self-pay

## 2020-05-21 VITALS — BP 142/89 | HR 81 | Temp 98.1°F | Ht 69.29 in | Wt 248.6 lb

## 2020-05-21 DIAGNOSIS — N4889 Other specified disorders of penis: Secondary | ICD-10-CM

## 2020-05-21 NOTE — Progress Notes (Signed)
BP (!) 142/89 (BP Location: Left Arm, Patient Position: Sitting, Cuff Size: Normal)    Pulse 81    Temp 98.1 F (36.7 C) (Oral)    Ht 5' 9.29" (1.76 m)    Wt 248 lb 9.6 oz (112.8 kg)    SpO2 98%    BMI 36.40 kg/m    Subjective:    Patient ID: Antonio Mueller, male    DOB: 1986-02-07, 34 y.o.   MRN: [id number]  HPI: Antonio Mueller is a 34 y.o. male  Chief Complaint  Patient presents with   Penis Pain    after intercorse. burning, itching, urge to urinate. sporadic. gotten worse, with erection. last 1-3 hours.    loss of precum    this is new.    painful urination   Worsening penile pain for 1.5 months, initially was only after sex at first and has now started hurting . Urgency, dysuria, burning mid-penile shaft that lasts an hour or so after intercourse or masturbation. No rashes, lesions, penile discharge, fevers, chills, stream issues, testicular lumps or swelling. Notes he does not produce any semen production prior to orgasm which has always been something he's experienced in the past. Erections feel uncomfortable at this point. Sxs mostly resolve after a few hours. Has never had any issues of this nature in the past.   Relevant past medical, surgical, family and social history reviewed and updated as indicated. Interim medical history since our last visit reviewed. Allergies and medications reviewed and updated.  Review of Systems  Per HPI unless specifically indicated above     Objective:    BP (!) 142/89 (BP Location: Left Arm, Patient Position: Sitting, Cuff Size: Normal)    Pulse 81    Temp 98.1 F (36.7 C) (Oral)    Ht 5' 9.29" (1.76 m)    Wt 248 lb 9.6 oz (112.8 kg)    SpO2 98%    BMI 36.40 kg/m   Wt Readings from Last 3 Encounters:  05/21/20 248 lb 9.6 oz (112.8 kg)  11/27/19 241 lb (109.3 kg)  10/24/19 248 lb 9.6 oz (112.8 kg)    Physical Exam Vitals and nursing note reviewed.  Constitutional:      Appearance: Normal appearance.  HENT:     Head: Atraumatic.    Eyes:     Extraocular Movements: Extraocular movements intact.     Conjunctiva/sclera: Conjunctivae normal.  Cardiovascular:     Rate and Rhythm: Normal rate and regular rhythm.  Pulmonary:     Effort: Pulmonary effort is normal.     Breath sounds: Normal breath sounds.  Genitourinary:    Comments: Exam deferred to Urology Musculoskeletal:        General: Normal range of motion.     Cervical back: Normal range of motion and neck supple.  Skin:    General: Skin is warm and dry.  Neurological:     General: No focal deficit present.     Mental Status: He is oriented to person, place, and time.  Psychiatric:        Mood and Affect: Mood normal.        Thought Content: Thought content normal.        Judgment: Judgment normal.     Results for orders placed or performed in visit on 11/27/19  Microscopic Examination   URINE  Result Value Ref Range   WBC, UA None seen 0 - 5 /hpf   RBC 0-2 0 - 2 /hpf   Epithelial Cells (  non renal) 0-10 0 - 10 /hpf   Bacteria, UA None seen None seen/Few  CBC with Differential/Platelet out  Result Value Ref Range   WBC 7.2 3.4 - 10.8 x10E3/uL   RBC 5.77 4.14 - 5.80 x10E6/uL   Hemoglobin 16.7 13.0 - 17.7 g/dL   Hematocrit 47.5 37.5 - 51.0 %   MCV 82 79 - 97 fL   MCH 28.9 26.6 - 33.0 pg   MCHC 35.2 31 - 35 g/dL   RDW 12.5 11.6 - 15.4 %   Platelets 283 150 - 450 x10E3/uL   Neutrophils 61 Not Estab. %   Lymphs 28 Not Estab. %   Monocytes 8 Not Estab. %   Eos 2 Not Estab. %   Basos 1 Not Estab. %   Neutrophils Absolute 4.4 1 - 7 x10E3/uL   Lymphocytes Absolute 2.0 0 - 3 x10E3/uL   Monocytes Absolute 0.6 0 - 0 x10E3/uL   EOS (ABSOLUTE) 0.2 0.0 - 0.4 x10E3/uL   Basophils Absolute 0.0 0 - 0 x10E3/uL   Immature Granulocytes 0 Not Estab. %   Immature Grans (Abs) 0.0 0.0 - 0.1 x10E3/uL  Comprehensive metabolic panel  Result Value Ref Range   Glucose 87 65 - 99 mg/dL   BUN 11 6 - 20 mg/dL   Creatinine, Ser 0.80 0.76 - 1.27 mg/dL   GFR calc non  Af Amer 117 >59 mL/min/1.73   GFR calc Af Amer 135 >59 mL/min/1.73   BUN/Creatinine Ratio 14 9 - 20   Sodium 136 134 - 144 mmol/L   Potassium 4.3 3.5 - 5.2 mmol/L   Chloride 96 96 - 106 mmol/L   CO2 22 20 - 29 mmol/L   Calcium 9.6 8.7 - 10.2 mg/dL   Total Protein 6.9 6.0 - 8.5 g/dL   Albumin 4.7 4.0 - 5.0 g/dL   Globulin, Total 2.2 1.5 - 4.5 g/dL   Albumin/Globulin Ratio 2.1 1.2 - 2.2   Bilirubin Total 0.8 0.0 - 1.2 mg/dL   Alkaline Phosphatase 58 39 - 117 IU/L   AST 20 0 - 40 IU/L   ALT 24 0 - 44 IU/L  Lipid Panel w/o Chol/HDL Ratio out  Result Value Ref Range   Cholesterol, Total 201 (H) 100 - 199 mg/dL   Triglycerides 84 0 - 149 mg/dL   HDL 42 >39 mg/dL   VLDL Cholesterol Cal 15 5 - 40 mg/dL   LDL Chol Calc (NIH) 144 (H) 0 - 99 mg/dL  TSH  Result Value Ref Range   TSH 2.900 0.450 - 4.500 uIU/mL  UA/M w/rflx Culture, Routine   Specimen: Urine   URINE  Result Value Ref Range   Specific Gravity, UA 1.020 1.005 - 1.030   pH, UA 6.0 5.0 - 7.5   Color, UA Yellow Yellow   Appearance Ur Clear Clear   Leukocytes,UA Negative Negative   Protein,UA Negative Negative/Trace   Glucose, UA Negative Negative   Ketones, UA 2+ (A) Negative   RBC, UA Trace (A) Negative   Bilirubin, UA Negative Negative   Urobilinogen, Ur 0.2 0.2 - 1.0 mg/dL   Nitrite, UA Negative Negative   Microscopic Examination See below:   Vitamin D (25 hydroxy)  Result Value Ref Range   Vit D, 25-Hydroxy 55.0 30.0 - 100.0 ng/mL      Assessment & Plan:   Problem List Items Addressed This Visit    None    Visit Diagnoses    Penile pain    -  Primary  U/A neg, no concern for STIs. Referral placed to Urology for further evaluation   Relevant Orders   Ambulatory referral to Urology       Follow up plan: Return if symptoms worsen or fail to improve.

## 2020-05-22 LAB — UA/M W/RFLX CULTURE, ROUTINE
Bilirubin, UA: NEGATIVE
Glucose, UA: NEGATIVE
Ketones, UA: NEGATIVE
Leukocytes,UA: NEGATIVE
Nitrite, UA: NEGATIVE
Protein,UA: NEGATIVE
RBC, UA: NEGATIVE
Specific Gravity, UA: 1.01 (ref 1.005–1.030)
Urobilinogen, Ur: 0.2 mg/dL (ref 0.2–1.0)
pH, UA: 7 (ref 5.0–7.5)

## 2020-05-22 NOTE — Addendum Note (Signed)
Addended by: Merrie Roof E on: 05/22/2020 11:19 AM   Modules accepted: Orders

## 2020-05-29 ENCOUNTER — Encounter: Payer: Self-pay | Admitting: Urology

## 2020-05-29 ENCOUNTER — Ambulatory Visit: Payer: Managed Care, Other (non HMO) | Admitting: Urology

## 2020-05-29 VITALS — BP 145/87 | HR 120 | Ht 70.0 in | Wt 250.0 lb

## 2020-05-29 DIAGNOSIS — N4889 Other specified disorders of penis: Secondary | ICD-10-CM | POA: Diagnosis not present

## 2020-05-29 DIAGNOSIS — R102 Pelvic and perineal pain: Secondary | ICD-10-CM

## 2020-05-29 DIAGNOSIS — R3915 Urgency of urination: Secondary | ICD-10-CM

## 2020-05-29 MED ORDER — TAMSULOSIN HCL 0.4 MG PO CAPS
0.4000 mg | ORAL_CAPSULE | Freq: Every day | ORAL | 1 refills | Status: DC
Start: 2020-05-29 — End: 2020-06-24

## 2020-05-29 NOTE — Progress Notes (Signed)
05/29/2020 8:38 AM   [name] 1986-08-27 [id number]  Referring provider: Volney American, PA-C [address] [place],  [place] [address]  Chief Complaint  Patient presents with   Penis Pain    HPI: Trevione Wert is a 34 y.o. male seen at the request of Merrie Roof, PA-C for evaluation of penile pain  -Symptom onset mid April 2021 -After intercourse noted burning pain mid penile shaft associated with urge to void -Pain resolved after 2 hours -Symptoms persisted for several weeks after intercourse and then progressed to the point where he would have pain and discomfort prior to ejaculation -Symptoms also present with masturbation -Longest duration of pain after ejaculation has been 3 hours -The day after this occurs will have mild achiness in penile shaft -Has noted decrease in urethral secretions during erections -Otherwise no bothersome lower urinary tract symptoms with the exception of urgency after ejaculation -Specifically denies urinary hesitancy, decreased stream, dysuria or gross hematuria -Urinalysis at PCP office earlier this month was negative -Monogamous relationship   PMH: Past Medical History:  Diagnosis Date   Colon polyp    Inflammatory bowel diseases (IBD)    Lichen striatus    Lyme disease    Migraine headache     Surgical History: Past Surgical History:  Procedure Laterality Date   COLONOSCOPY  03/05/2018   Procedure: COLONOSCOPY;  Surgeon: Lin Landsman, MD;  Location: [facility] ENDOSCOPY;  Service: Gastroenterology;;   COLONOSCOPY W/ POLYPECTOMY     FLEXIBLE SIGMOIDOSCOPY     HERNIA REPAIR     1 as a child    Home Medications:  Allergies as of 05/29/2020      Reactions   Propofol    Other reaction(s): Other (See Comments) Memory problems   Nickel Rash      Medication List       Accurate as of May 29, 2020  8:38 AM. If you have any questions, ask your nurse or doctor.        bifidobacterium infantis capsule Take 1  capsule by mouth daily.   DIGESTIVE ADVANTAGE PO Take by mouth.   FIBER PO Take 5 capsules by mouth 3 (three) times daily.   multivitamin with minerals tablet Take 1 tablet by mouth daily.   VITAMIN D PO Take by mouth daily.       Allergies:  Allergies  Allergen Reactions   Propofol     Other reaction(s): Other (See Comments) Memory problems   Nickel Rash    Family History: Family History  Problem Relation Age of Onset   Thyroid disease Mother    Cancer Mother        Basal Cell Carinoma   Hepatitis C Father    Arthritis Father        Rheumatoid   Cancer Maternal Grandmother        Breast   Heart disease Maternal Grandfather    Alzheimer's disease Maternal Grandfather    Alzheimer's disease Paternal Grandmother    Heart disease Paternal Grandfather    Congestive Heart Failure Paternal Grandfather    Parkinson's disease Paternal Grandfather    Crohn's disease Cousin    Cancer Other        Colon Cancer   Cancer Other        Colon    Social History:  reports that he has never smoked. He has never used smokeless tobacco. He reports previous alcohol use. He reports that he does not use drugs.   Physical Exam: BP [name] Kitchen)  145/87    Pulse (!) 120    Ht 5\' 10"  (1.778 m)    Wt 250 lb (113.4 kg)    BMI 35.87 kg/m   Constitutional:  Alert and oriented, No acute distress. HEENT: [place] AT, moist mucus membranes.  Trachea midline, no masses. Cardiovascular: No clubbing, cyanosis, or edema. Respiratory: Normal respiratory effort, no increased work of breathing GU: Phallus without lesions, no mass, tenderness, plaque.  Testes descended bilaterally without masses or tenderness.  Spermatic cord/epididymis palpably normal bilaterally.  Prostate 30 g smooth without nodules, nontender.  No pelvic floor tenderness Skin: No rashes, bruises or suspicious lesions. Neurologic: Grossly intact, no focal deficits, moving all 4 extremities. Psychiatric: Normal mood and  affect.  Laboratory Data:  Urinalysis    Component Value Date/Time   APPEARANCEUR Clear 05/21/2020 1620   GLUCOSEU Negative 05/21/2020 1620   BILIRUBINUR Negative 05/21/2020 1620   PROTEINUR Negative 05/21/2020 1620   NITRITE Negative 05/21/2020 1620   LEUKOCYTESUR Negative 05/21/2020 1620    Assessment & Plan:    1. Penile pain -Discussed with Mr. Ricciardi the most common cause of his symptoms would be inflammatory prostatitis which would explain the relationship to ejaculation, penile pain and urge -Recommended trial tamsulosin 0.4 mg daily -Follow-up 1 month for symptom reassessment and if no improvement will perform cystoscopy at that visit -If cystoscopy unremarkable will order pelvic [place], MD  [facility] [address] [place], [place] [address] [phone]

## 2020-06-23 ENCOUNTER — Encounter: Payer: Self-pay | Admitting: Urology

## 2020-06-24 ENCOUNTER — Other Ambulatory Visit: Payer: Self-pay | Admitting: *Deleted

## 2020-06-24 MED ORDER — TAMSULOSIN HCL 0.4 MG PO CAPS: 0.4000 mg | ORAL_CAPSULE | Freq: Every day | ORAL | 1 refills | Status: DC

## 2020-06-27 NOTE — Progress Notes (Signed)
06/29/2020 9:23 AM   [name] [birth year] [id number]  Referring provider: Volney American, PA-C [address] [place],  [place] [address] Chief Complaint  Patient presents with  . Cysto    HPI: Antonio Mueller is a 34 y.o. male with penile/ejaculatory pain returns today for follow-up.  -After intercourse noted burning pain mid penile shaft associated with urge to void (onset mid 03/2020). -Pain resolved after 2 hours -Symptoms persisted for several weeks after intercourse and then progressed to the point where he would have pain and discomfort prior to ejaculation -Symptoms also present with masturbation -Longest duration of pain after ejaculation has been 3 hours -The day after this occurs will have mild achiness in penile shaft -Has noted decrease in urethral secretions during erections -Patient had improvement with tamsulosin 1.5 weeks in. -Urethral secretions have returned -Has occasional pain/discomfort and burning in the middle of his shaft for a duration of 30 minutes to 1 hour. -One week ago penile pain lasted longer than 1 hour. -Notes feeling lightheaded at the time he started tamsulosin.   PMH: Past Medical History:  Diagnosis Date  . Colon polyp   . Inflammatory bowel diseases (IBD)   . Lichen striatus   . Lyme disease   . Migraine headache     Surgical History: Past Surgical History:  Procedure Laterality Date  . COLONOSCOPY  03/05/2018   Procedure: COLONOSCOPY;  Surgeon: Lin Landsman, MD;  Location: [facility] ENDOSCOPY;  Service: Gastroenterology;;  . COLONOSCOPY W/ POLYPECTOMY    . FLEXIBLE SIGMOIDOSCOPY    . HERNIA REPAIR     1 as a child    Home Medications:  Allergies as of 06/29/2020      Reactions   Propofol    Other reaction(s): Other (See Comments) Memory problems   Nickel Rash      Medication List       Accurate as of June 29, 2020  9:23 AM. If you have any questions, ask your nurse or doctor.        bifidobacterium  infantis capsule Take 1 capsule by mouth daily.   DIGESTIVE ADVANTAGE PO Take by mouth.   FIBER PO Take 5 capsules by mouth 3 (three) times daily.   multivitamin with minerals tablet Take 1 tablet by mouth daily.   tamsulosin 0.4 MG Caps capsule Commonly known as: FLOMAX Take 1 capsule (0.4 mg total) by mouth daily.   VITAMIN D PO Take by mouth daily.       Allergies:  Allergies  Allergen Reactions  . Propofol     Other reaction(s): Other (See Comments) Memory problems  . Nickel Rash    Family History: Family History  Problem Relation Age of Onset  . Thyroid disease Mother   . Cancer Mother        Basal Cell Carinoma  . Hepatitis C Father   . Arthritis Father        Rheumatoid  . Cancer Maternal Grandmother        Breast  . Heart disease Maternal Grandfather   . Alzheimer's disease Maternal Grandfather   . Alzheimer's disease Paternal Grandmother   . Heart disease Paternal Grandfather   . Congestive Heart Failure Paternal Grandfather   . Parkinson's disease Paternal Grandfather   . Crohn's disease Cousin   . Cancer Other        Colon Cancer  . Cancer Other        Colon    Social History:  reports that he has  never smoked. He has never used smokeless tobacco. He reports previous alcohol use. He reports that he does not use drugs.   Physical Exam: BP (!) 157/100   Pulse (!) 123   Ht 5\' 9"  (1.753 m)   Wt (!) 250 lb (113.4 kg)   BMI 36.92 kg/m   Constitutional:  Alert and oriented, No acute distress. HEENT: [place] AT, moist mucus membranes.  Trachea midline, no masses. Cardiovascular: No clubbing, cyanosis, or edema. Respiratory: Normal respiratory effort, no increased work of breathing. Skin: No rashes, bruises or suspicious lesions. Neurologic: Grossly intact, no focal deficits, moving all 4 extremities. Psychiatric: Normal mood and affect.   Urinalysis Negative   Assessment & Plan:   1.  Pelvic pain -Improved on tamsulosin; most likely  secondary to inflammatory prostatitis -Cancel cysto today due to symptomatic improvement.   -Due to lightheadedness will discontinue tamsulosin and treat with silodosin for an additional 2 months -Rx silodosin sent.  Follow up in 2 months.   [facility] [address] [place], [place] [address] [phone]  I, Selena Batten, am acting as a scribe for Dr. Nicki Reaper C. Stoioff,  I have reviewed the above documentation for accuracy and completeness, and I agree with the above.   Abbie Sons, MD

## 2020-06-29 ENCOUNTER — Ambulatory Visit (INDEPENDENT_AMBULATORY_CARE_PROVIDER_SITE_OTHER): Payer: Managed Care, Other (non HMO) | Admitting: Urology

## 2020-06-29 ENCOUNTER — Other Ambulatory Visit: Payer: Self-pay

## 2020-06-29 ENCOUNTER — Encounter: Payer: Self-pay | Admitting: Urology

## 2020-06-29 VITALS — BP 157/100 | HR 123 | Ht 69.0 in | Wt 250.0 lb

## 2020-06-29 DIAGNOSIS — N5312 Painful ejaculation: Secondary | ICD-10-CM

## 2020-06-29 DIAGNOSIS — R350 Frequency of micturition: Secondary | ICD-10-CM | POA: Diagnosis not present

## 2020-06-29 DIAGNOSIS — N411 Chronic prostatitis: Secondary | ICD-10-CM | POA: Diagnosis not present

## 2020-06-29 LAB — URINALYSIS, COMPLETE
Bilirubin, UA: NEGATIVE
Glucose, UA: NEGATIVE
Ketones, UA: NEGATIVE
Leukocytes,UA: NEGATIVE
Nitrite, UA: NEGATIVE
Protein,UA: NEGATIVE
RBC, UA: NEGATIVE
Specific Gravity, UA: 1.01 (ref 1.005–1.030)
Urobilinogen, Ur: 0.2 mg/dL (ref 0.2–1.0)
pH, UA: 7.5 (ref 5.0–7.5)

## 2020-06-29 LAB — MICROSCOPIC EXAMINATION: Bacteria, UA: NONE SEEN

## 2020-06-29 MED ORDER — SILODOSIN 8 MG PO CAPS
8.0000 mg | ORAL_CAPSULE | Freq: Every day | ORAL | 0 refills | Status: DC
Start: 2020-06-29 — End: 2020-08-24

## 2020-07-10 ENCOUNTER — Other Ambulatory Visit: Payer: Self-pay

## 2020-07-10 ENCOUNTER — Encounter: Payer: Self-pay | Admitting: Family Medicine

## 2020-07-10 ENCOUNTER — Ambulatory Visit (INDEPENDENT_AMBULATORY_CARE_PROVIDER_SITE_OTHER): Payer: Managed Care, Other (non HMO) | Admitting: Family Medicine

## 2020-07-10 ENCOUNTER — Other Ambulatory Visit (HOSPITAL_COMMUNITY)
Admission: RE | Admit: 2020-07-10 | Discharge: 2020-07-10 | Disposition: A | Discharge status: Home / Self Care | Payer: Managed Care, Other (non HMO) | Source: Ambulatory Visit | Attending: Family Medicine | Admitting: Family Medicine

## 2020-07-10 VITALS — BP 123/82 | HR 72 | Temp 98.4°F | Wt 252.0 lb

## 2020-07-10 DIAGNOSIS — D489 Neoplasm of uncertain behavior, unspecified: Secondary | ICD-10-CM | POA: Insufficient documentation

## 2020-07-10 NOTE — Progress Notes (Signed)
BP 123/82   Pulse 72   Temp 98.4 F (36.9 C) (Oral)   Wt 252 lb (114.3 kg)   SpO2 97%   BMI 37.21 kg/m    Subjective:    Patient ID: Antonio Mueller, male    DOB: February 15, 1986, 34 y.o.   MRN: [id number]  HPI: Antonio Mueller is a 34 y.o. male  Chief Complaint  Patient presents with  . Cyst    on right/middle neck x 3 months   Noticed a place on center of anterior neck about 3 months ago. No pain, drainage, itching since onset. Not growing very noticeably but not getting smaller. Hard, crusty at times. Not trying anything OTC for relief as he didn't know what to try. No associated sxs, no hx of skin cancers.   Relevant past medical, surgical, family and social history reviewed and updated as indicated. Interim medical history since our last visit reviewed. Allergies and medications reviewed and updated.  Review of Systems  Per HPI unless specifically indicated above     Objective:    BP 123/82   Pulse 72   Temp 98.4 F (36.9 C) (Oral)   Wt 252 lb (114.3 kg)   SpO2 97%   BMI 37.21 kg/m   Wt Readings from Last 3 Encounters:  07/10/20 252 lb (114.3 kg)  06/29/20 (!) 250 lb (113.4 kg)  05/29/20 250 lb (113.4 kg)    Physical Exam Vitals and nursing note reviewed.  Constitutional:      Appearance: Normal appearance.  HENT:     Head: Atraumatic.  Eyes:     Extraocular Movements: Extraocular movements intact.     Conjunctiva/sclera: Conjunctivae normal.  Cardiovascular:     Rate and Rhythm: Normal rate and regular rhythm.  Pulmonary:     Effort: Pulmonary effort is normal.     Breath sounds: Normal breath sounds.  Musculoskeletal:        General: Normal range of motion.     Cervical back: Normal range of motion and neck supple.  Skin:    General: Skin is warm and dry.     Comments: Cutaneous horn? present right anterior neck  Neurological:     General: No focal deficit present.     Mental Status: He is oriented to person, place, and time.  Psychiatric:         Mood and Affect: Mood normal.        Thought Content: Thought content normal.        Judgment: Judgment normal.    Procedure: Shave biopsy/excision skin lesion anterior neck Questions regarding procedure reviewed and answered adequately. Area prepped with alcohol pad and infiltrated with 3 cc of 1% lidocaine with epi. Area then prepped with betadine, and dermablade used to excise lesion flush with skin. Lesion removed in totality and placed in specimen cup to send off to pathology. Gauze, pressure, and silver nitrate sticks used to achieve hemostasis. Blood loss minimal. Area dressed with triple antibiotic ointment and covered with a pressure dressing. Wound care reviewed. No immediate complications, procedure tolerated well.   Results for orders placed or performed in visit on 06/29/20  Microscopic Examination   Urine  Result Value Ref Range   WBC, UA 0-5 0 - 5 /hpf   RBC 0-2 0 - 2 /hpf   Epithelial Cells (non renal) 0-10 0 - 10 /hpf   Bacteria, UA None seen None seen/Few  Urinalysis, Complete  Result Value Ref Range   Specific Gravity, UA 1.010 1.005 -  1.030   pH, UA 7.5 5.0 - 7.5   Color, UA Yellow Yellow   Appearance Ur Clear Clear   Leukocytes,UA Negative Negative   Protein,UA Negative Negative/Trace   Glucose, UA Negative Negative   Ketones, UA Negative Negative   RBC, UA Negative Negative   Bilirubin, UA Negative Negative   Urobilinogen, Ur 0.2 0.2 - 1.0 mg/dL   Nitrite, UA Negative Negative   Microscopic Examination See below:       Assessment & Plan:   Problem List Items Addressed This Visit    None    Visit Diagnoses    Neoplasm of uncertain behavior    -  Primary   Shave biopsy performed today, await results and treat further if needed. Wound care reviewed, f/u if not healing well   Relevant Orders   Surgical pathology       Follow up plan: Return if symptoms worsen or fail to improve.

## 2020-07-13 ENCOUNTER — Encounter: Payer: Self-pay | Admitting: Urology

## 2020-07-16 LAB — SURGICAL PATHOLOGY

## 2020-08-23 NOTE — Progress Notes (Signed)
08/24/2020 1:06 PM   Antonio Mueller Jan 05, 1986 [id number]  Referring provider: Volney American, PA-C [address] [place],  [place] [address] Chief Complaint  Patient presents with  . Prostatitis    HPI: Antonio Mueller is a 34 y.o. male who returns for a 2 month follow up of pelvic pain.   -Patient was last evaluated on 06/29/2020. (see note for further details) -Had occasional pain/discomfort and burning in the middle of his shaft for a duration of 30 minutes to 1 hour.  -1 Week prior penile pain lasted longer the 1 hour. -He felt lightheaded at the time of starting tamsulosin but symptoms did improve.  -Today he reports that his lightheadedness resolved after stopping tamsulosin.  -When started silodosin his sex drive diminished, his morning erections went away completely, this resolved after stopping the silodosin -Back on tamsulosin, his lightheaded has returned and is bothersome. He also reports 1 to 2 time a week he will have burning and pain for 1-3 hours.  -He had burning and pain x 3 hours with associated urge to urinate, the next day he noticed a hard vein at the tip of his penis. -He feels like symptoms have improved some.    PMH: Past Medical History:  Diagnosis Date  . Colon polyp   . Inflammatory bowel diseases (IBD)   . Lichen striatus   . Lyme disease   . Migraine headache     Surgical History: Past Surgical History:  Procedure Laterality Date  . COLONOSCOPY  03/05/2018   Procedure: COLONOSCOPY;  Surgeon: Lin Landsman, MD;  Location: [facility] ENDOSCOPY;  Service: Gastroenterology;;  . COLONOSCOPY W/ POLYPECTOMY    . FLEXIBLE SIGMOIDOSCOPY    . HERNIA REPAIR     1 as a child    Home Medications:  Allergies as of 08/24/2020      Reactions   Propofol    Other reaction(s): Other (See Comments) Memory problems   Nickel Rash      Medication List       Accurate as of August 24, 2020  1:06 PM. If you have any questions, ask your nurse or doctor.         STOP taking these medications   silodosin 8 MG Caps capsule Commonly known as: RAPAFLO Stopped by: Abbie Sons, MD     TAKE these medications   bifidobacterium infantis capsule Take 1 capsule by mouth daily.   DIGESTIVE ADVANTAGE PO Take by mouth.   FIBER PO Take 5 capsules by mouth 3 (three) times daily.   multivitamin with minerals tablet Take 1 tablet by mouth daily.   tamsulosin 0.4 MG Caps capsule Commonly known as: FLOMAX Take 0.4 mg by mouth.   VITAMIN D PO Take by mouth daily.       Allergies:  Allergies  Allergen Reactions  . Propofol     Other reaction(s): Other (See Comments) Memory problems  . Nickel Rash    Family History: Family History  Problem Relation Age of Onset  . Thyroid disease Mother   . Cancer Mother        Basal Cell Carinoma  . Hepatitis C Father   . Arthritis Father        Rheumatoid  . Cancer Maternal Grandmother        Breast  . Heart disease Maternal Grandfather   . Alzheimer's disease Maternal Grandfather   . Alzheimer's disease Paternal Grandmother   . Heart disease Paternal Grandfather   . Congestive Heart Failure  Paternal Grandfather   . Parkinson's disease Paternal Grandfather   . Crohn's disease Cousin   . Cancer Other        Colon Cancer  . Cancer Other        Colon    Social History:  reports that he has never smoked. He has never used smokeless tobacco. He reports previous alcohol use. He reports that he does not use drugs.   Physical Exam: BP 124/83   Pulse (!) 103   Ht 5\' 9"  (1.753 m)   Wt 250 lb (113.4 kg)   BMI 36.92 kg/m   Constitutional:  Alert and oriented, No acute distress. HEENT: [place] AT, moist mucus membranes.  Trachea midline, no masses. Cardiovascular: No clubbing, cyanosis, or edema. Respiratory: Normal respiratory effort, no increased work of breathing. GU: No CVA tenderness.  Thrombosed vein distal shaft dorsally Skin: No rashes, bruises or suspicious lesions. Neurologic:  Grossly intact, no focal deficits, moving all 4 extremities. Psychiatric: Normal mood and affect.   Assessment & Plan:    1.  Male pelvic pain most likely prostatic in origin  Schedule pelvic CT scan to rule out any underlying issues.  If CT is unremarkable will refer patient for pelvic floor PT  Discontinue tamsulosin   [facility] [address] [place], [place] [address] [phone]  I, Selena Batten, am acting as a scribe for Dr. Nicki Reaper C. Makayli Bracken,  I have reviewed the above documentation for accuracy and completeness, and I agree with the above.   Abbie Sons, MD

## 2020-08-24 ENCOUNTER — Ambulatory Visit: Payer: Managed Care, Other (non HMO) | Admitting: Urology

## 2020-08-24 ENCOUNTER — Encounter: Payer: Self-pay | Admitting: Urology

## 2020-08-24 ENCOUNTER — Other Ambulatory Visit: Payer: Self-pay

## 2020-08-24 VITALS — BP 124/83 | HR 103 | Ht 69.0 in | Wt 250.0 lb

## 2020-08-24 DIAGNOSIS — R102 Pelvic and perineal pain: Secondary | ICD-10-CM

## 2020-09-01 IMAGING — CR RIGHT SHOULDER - 2+ VIEW
1 series · 3 of 3 positions shown · non-contrast
Comparison: None.

CLINICAL DATA: Shoulder pain for 5 months without injury.

EXAM:
RIGHT SHOULDER - 2+ VIEW

[Series 1: dg shoulder right · 0.14mm/px · 3 of 3 slices shown]
[im 1/3]
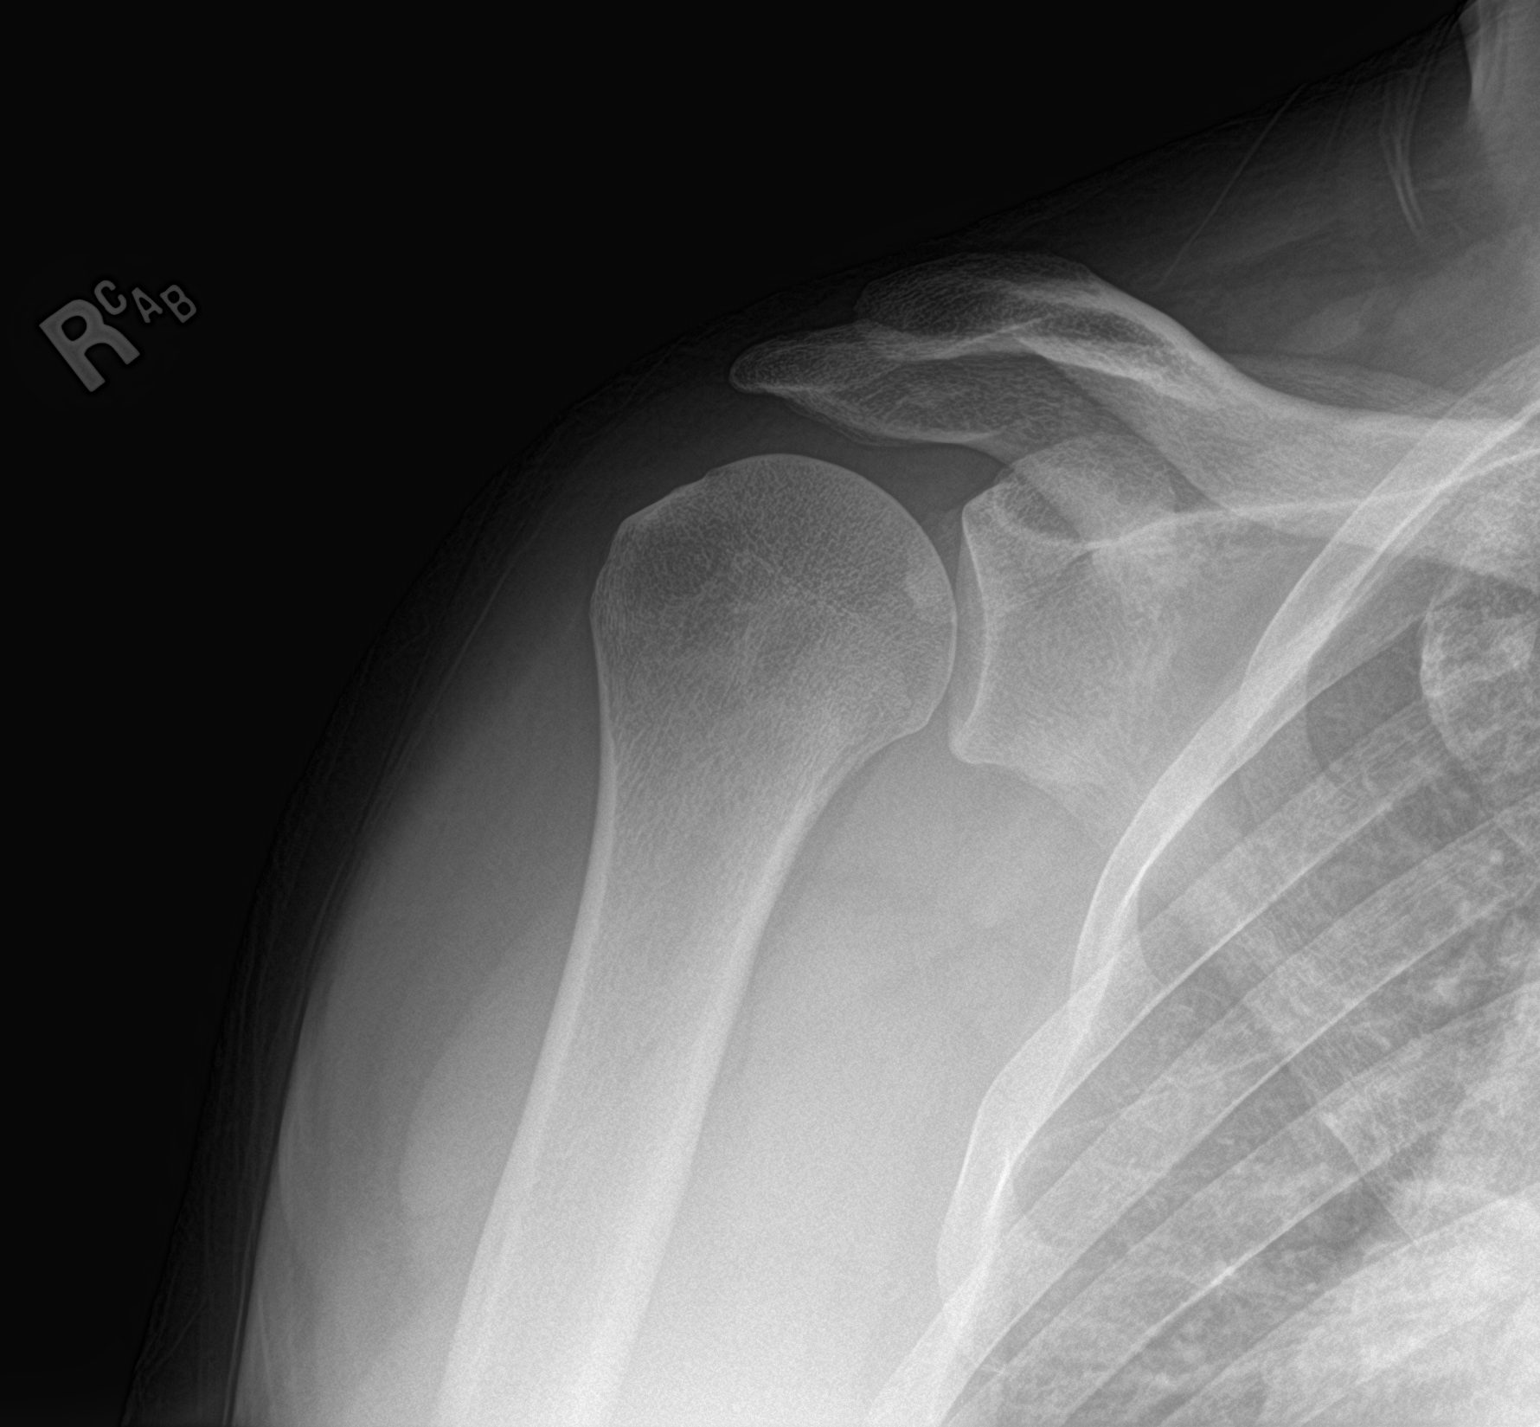
[im 2/3]
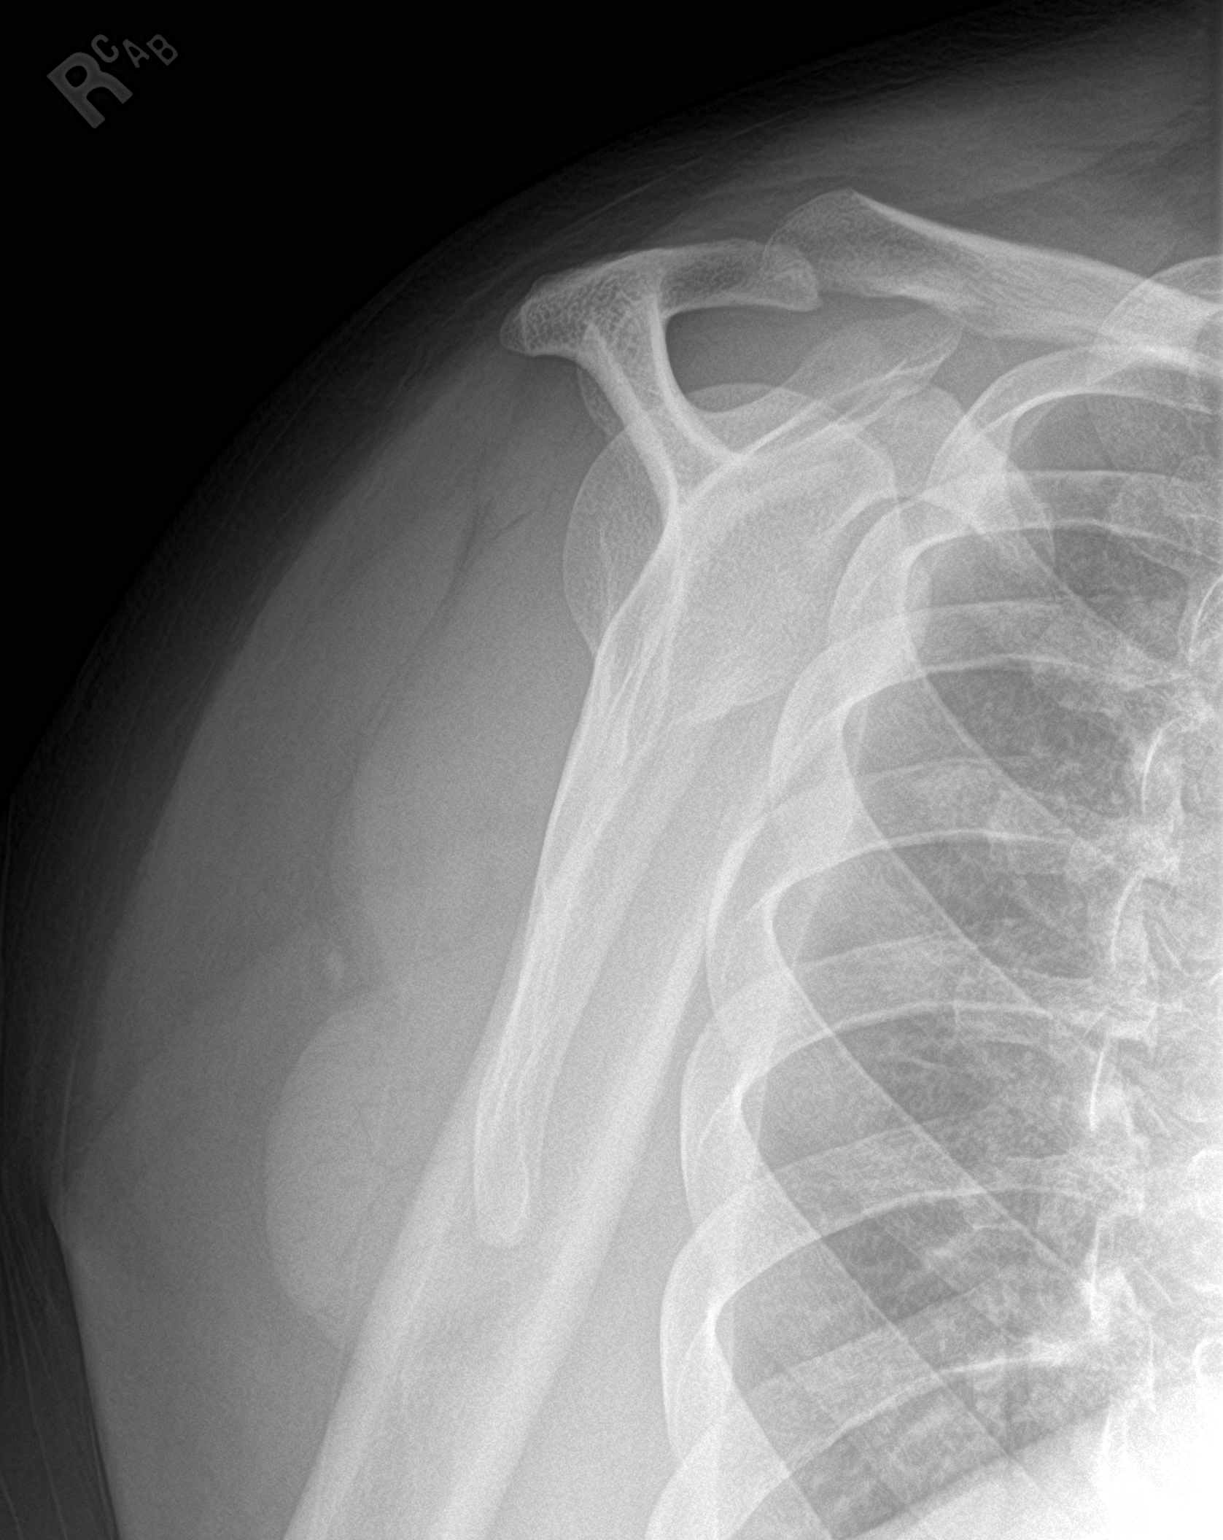
[im 3/3]
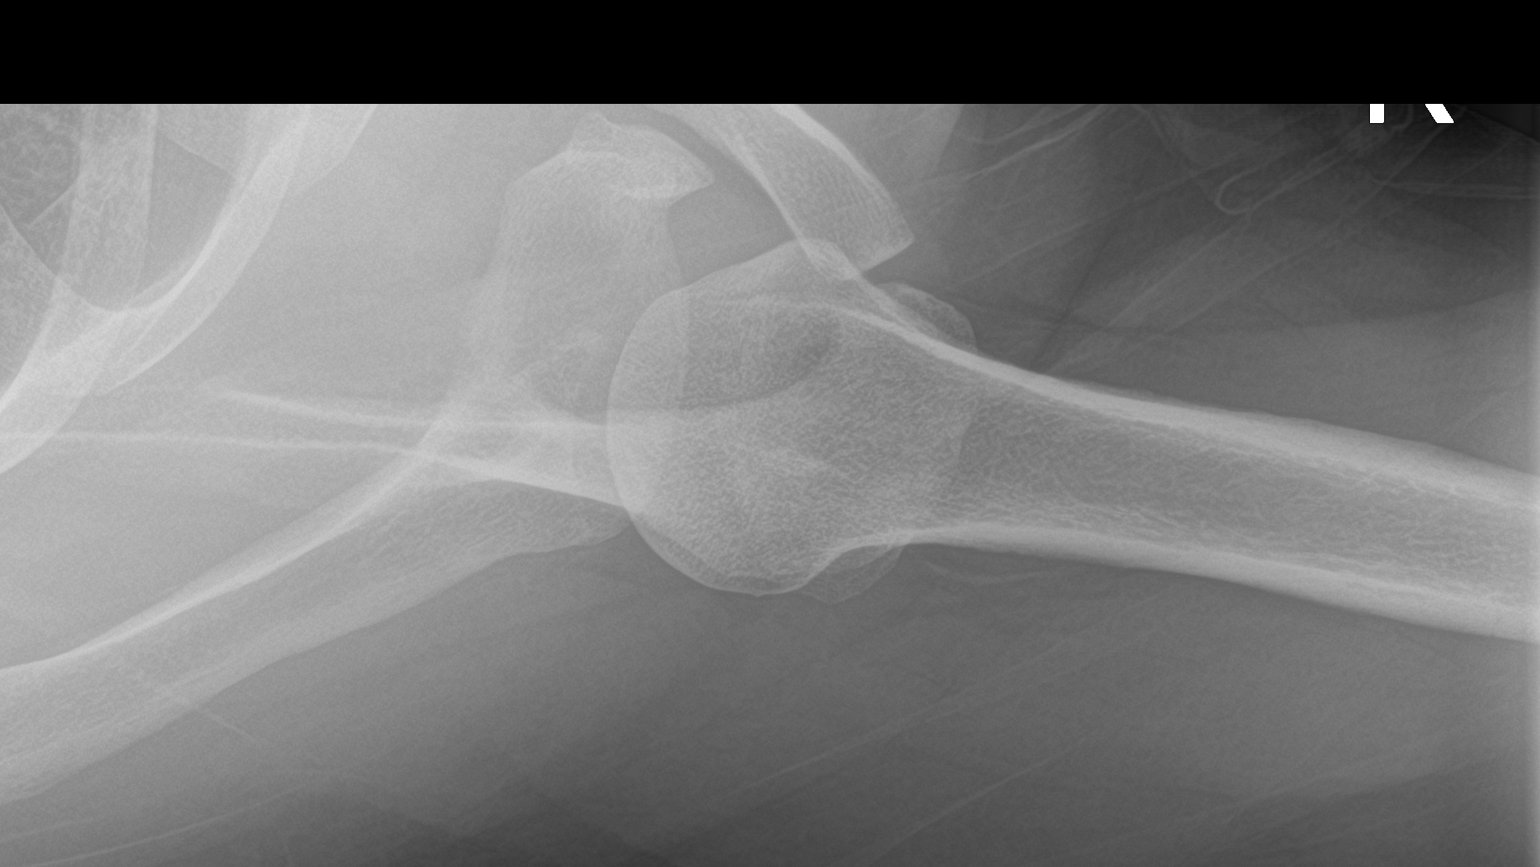

[3 of 3 positions shown; findings below may reference images not displayed]

FINDINGS: There is no evidence of fracture or dislocation. There is no
evidence of arthropathy or other focal bone abnormality. Soft
tissues are unremarkable.
IMPRESSION: Negative.

## 2020-09-04 ENCOUNTER — Telehealth: Payer: Self-pay | Admitting: Urology

## 2020-09-04 NOTE — Telephone Encounter (Signed)
Insurance denied CT scan he needs an [place] first. Message sent to MD  [employer]

## 2020-09-07 ENCOUNTER — Other Ambulatory Visit: Payer: Self-pay | Admitting: Urology

## 2020-09-07 DIAGNOSIS — R102 Pelvic and perineal pain: Secondary | ICD-10-CM

## 2020-09-11 IMAGING — US SOFT TISSUE ULTRASOUND HEAD/NECK
1 series · 14 of 25 positions shown · non-contrast
Comparison: None.

CLINICAL DATA: Right neck pain

EXAM:
ULTRASOUND OF HEAD/NECK SOFT TISSUES
TECHNIQUE: Ultrasound examination of the head and neck soft tissues was
performed in the area of clinical concern.

[Series 1: soft tissue ultrasound head/neck · 14 of 30 slices shown]
[im 1/30]
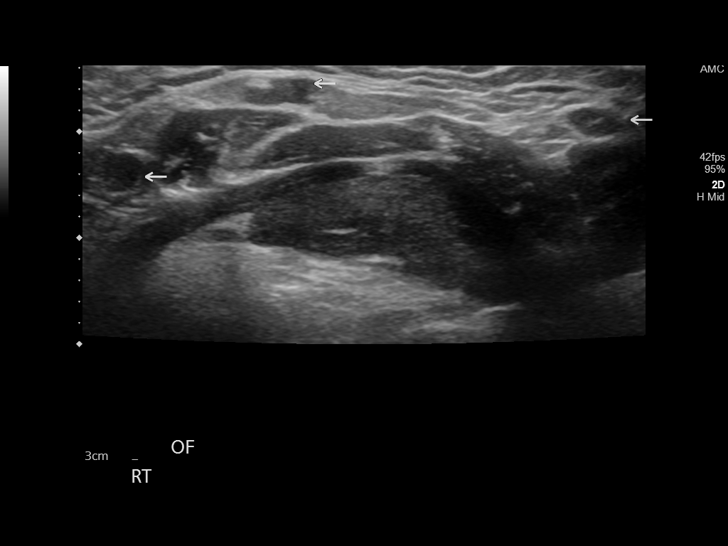
[im 3/30]
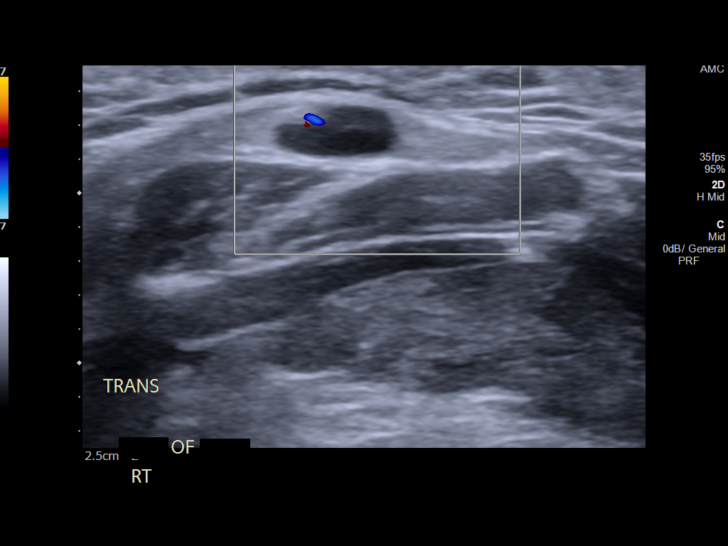
[im 5/30]
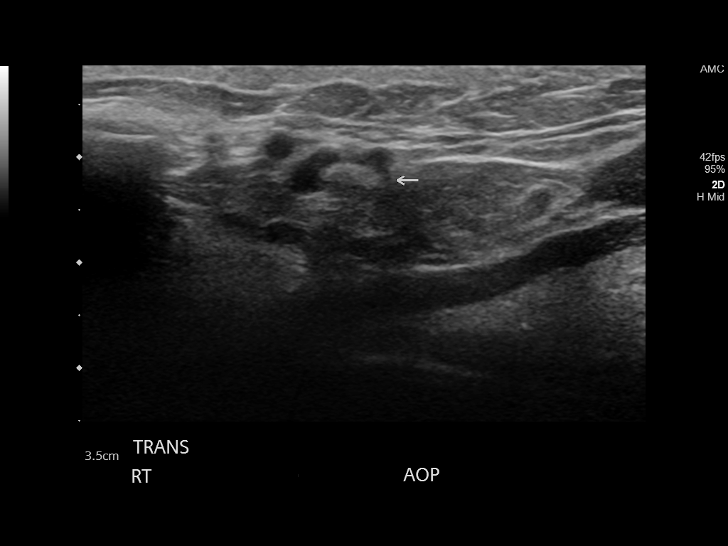
[im 8/30]
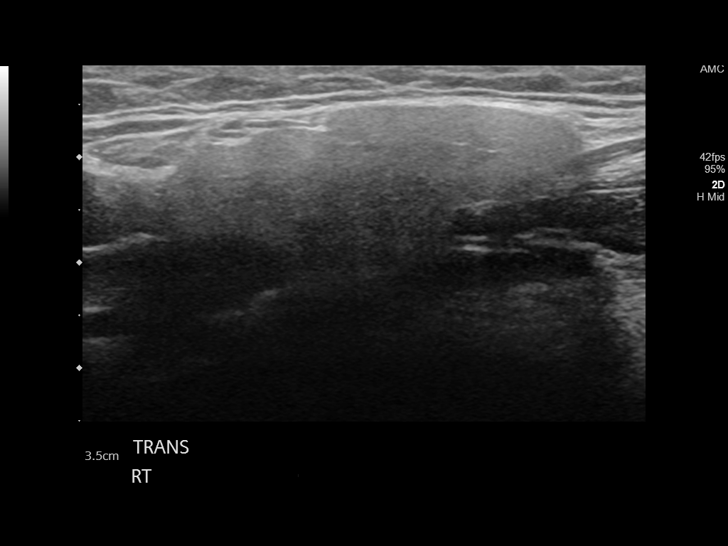
[im 10/30]
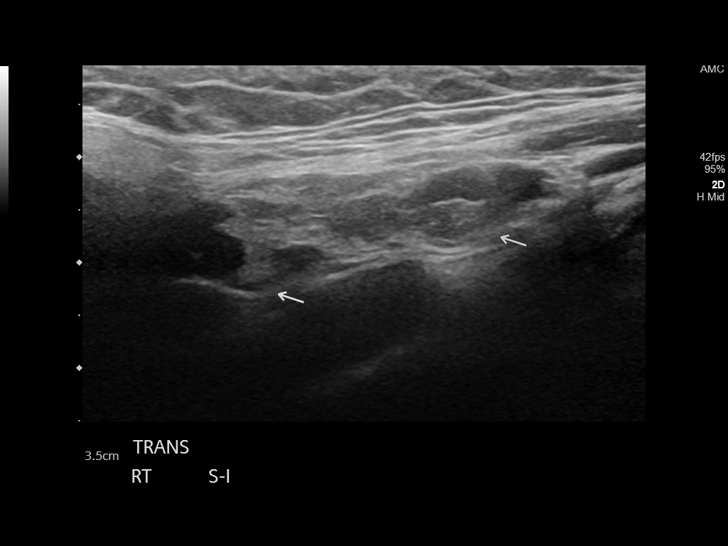
[im 11/30]
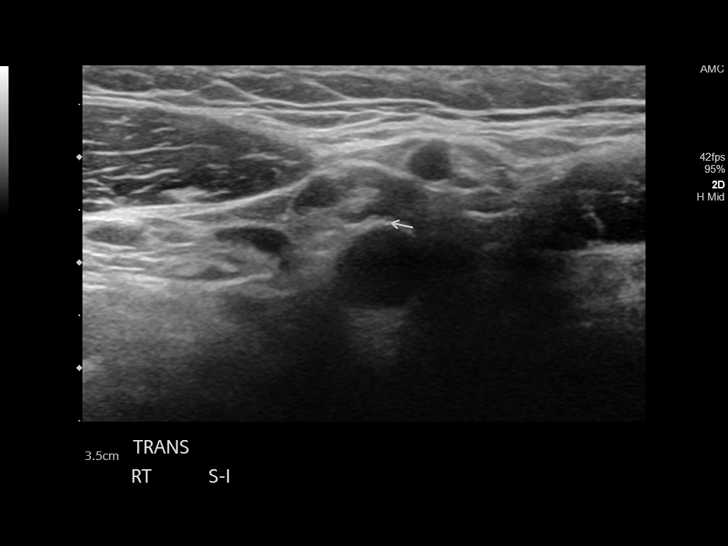
[im 14/30]
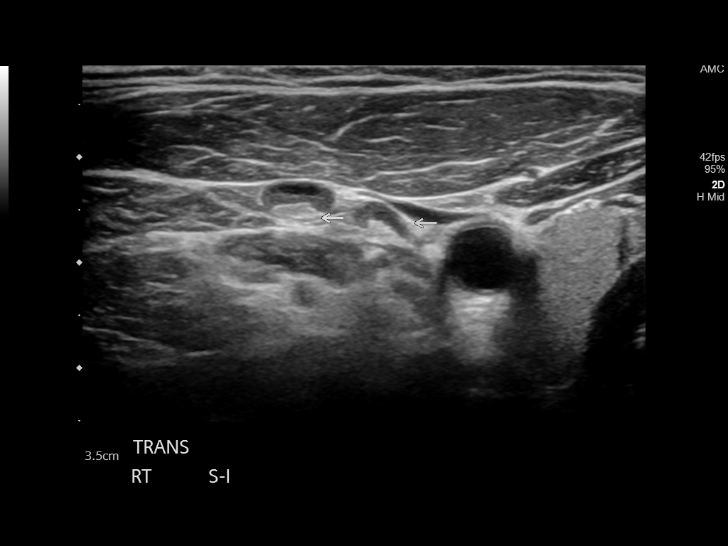
[im 16/30]
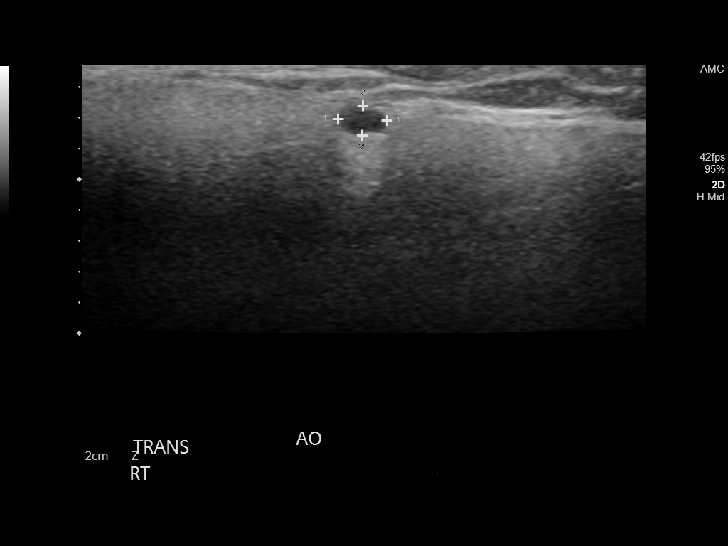
[im 19/30]
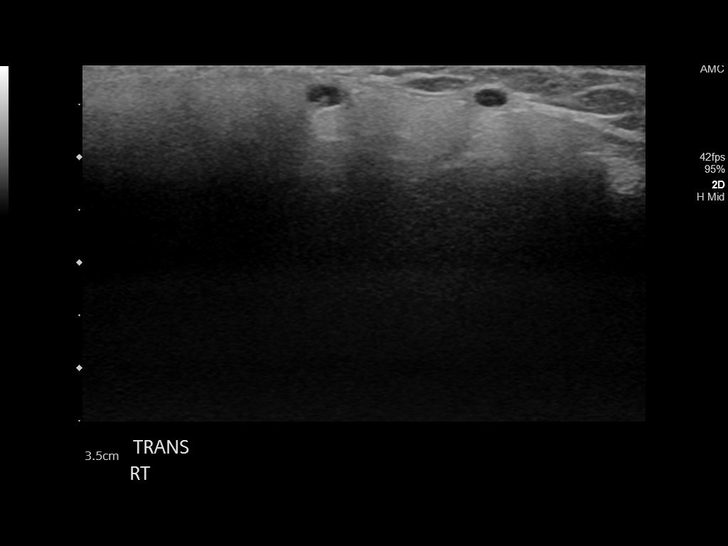
[im 20/30]
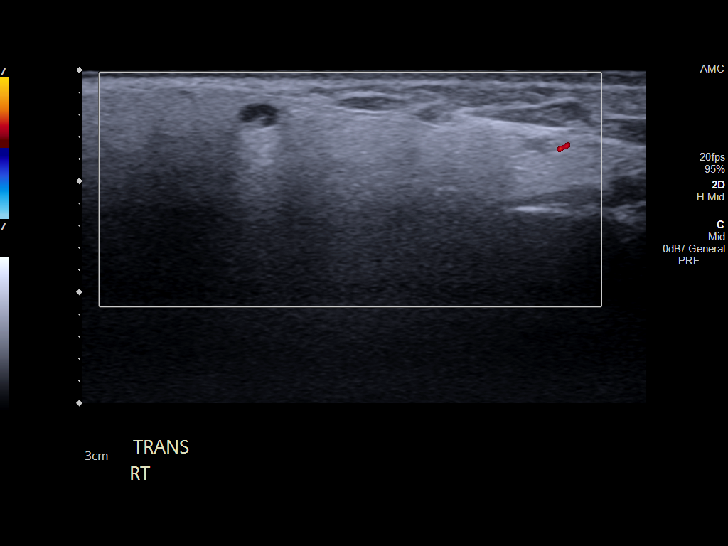
[im 22/30]
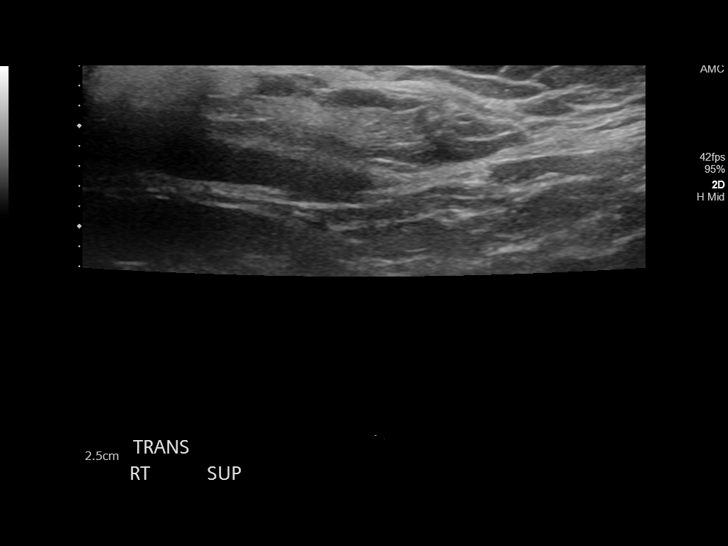
[im 25/30]
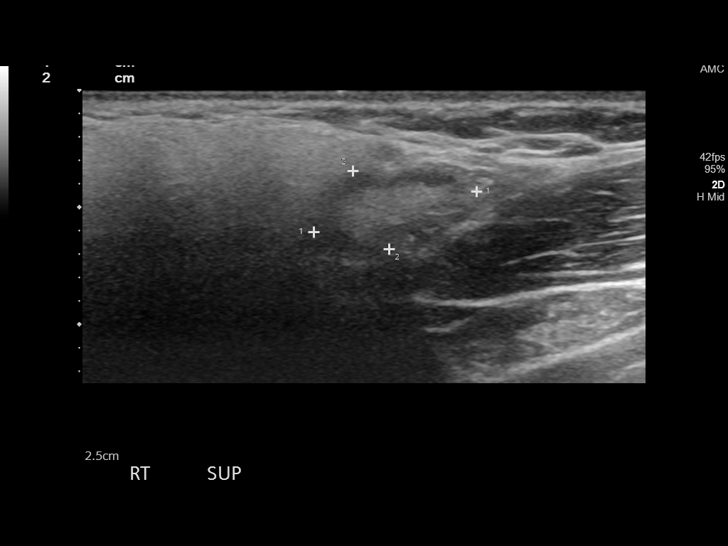
[im 27/30]
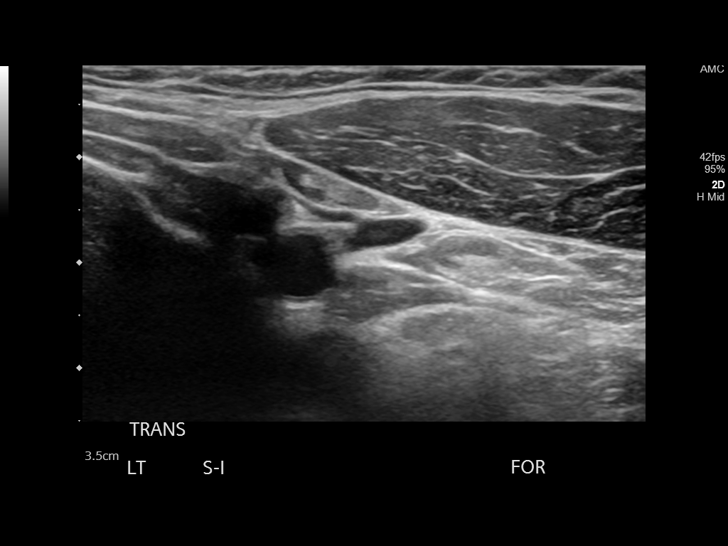
[im 30/30]
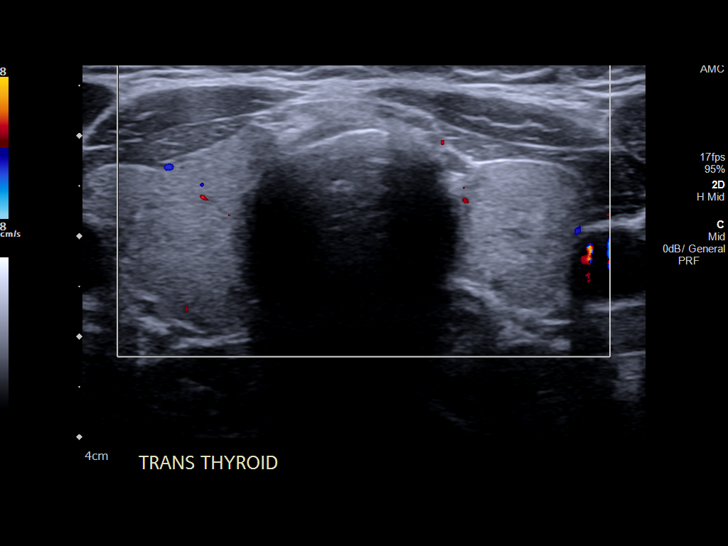

[14 of 25 positions shown; findings below may reference images not displayed]

FINDINGS: Multiple measured renoform nodules consistent with lymph nodes.
These have homogeneous hila and cortex with no worrisome
enlargement. The largest measures 1.9 cm in length and 5 mm in
thickness, allowable for jugulodigastric station. No adjacent
collection or solid mass is seen.
IMPRESSION: Benign-appearing lymph nodes in the neck.  Negative for mass.

## 2020-09-24 ENCOUNTER — Other Ambulatory Visit: Payer: Self-pay | Admitting: Urology

## 2020-09-24 DIAGNOSIS — R102 Pelvic and perineal pain: Secondary | ICD-10-CM

## 2020-09-26 ENCOUNTER — Other Ambulatory Visit: Payer: Self-pay | Admitting: Urology

## 2021-03-03 ENCOUNTER — Other Ambulatory Visit: Payer: Self-pay

## 2021-03-03 ENCOUNTER — Ambulatory Visit: Payer: Managed Care, Other (non HMO) | Admitting: Nurse Practitioner

## 2021-03-03 ENCOUNTER — Encounter: Payer: Self-pay | Admitting: Nurse Practitioner

## 2021-03-03 VITALS — BP 125/90 | HR 75 | Temp 98.5°F | Wt 260.0 lb

## 2021-03-03 DIAGNOSIS — H9201 Otalgia, right ear: Secondary | ICD-10-CM

## 2021-03-03 NOTE — Progress Notes (Signed)
BP 125/90   Pulse 75   Temp 98.5 F (36.9 C)   Wt 260 lb (117.9 kg)   SpO2 100%   BMI 38.40 kg/m    Subjective:    Patient ID: Antonio Mueller, male    DOB: [birth year], 35 y.o.   MRN: [id number]  HPI: Antonio Mueller is a 35 y.o. male  Chief Complaint  Patient presents with  . facial pressure    Right ear will get hot and painful.   . Mass    Behind right ear, will come and go   . Neck Pain    Very sensitive and tender.  Tender right nostril   EAR PAIN Patient states that his right ear gets hot and painful, mass behind the right ear that comes and goes (it lasts about a week then goes away and comes back about 1-2 months later), and his neck is very sensitive and tender. Patient has been dealing with this for a long time.  States it has worsened in the last 4-5 months.  Patient states that several times a day his ear will get hot, red and uncomfortable. Patient states he also has right sided facial pain and jaw pain that is also tender and the pain goes to the right nostril that is tender.   Patient has seen ENT, had thyroid scans in the past that haven't shown any reasons for his symptoms.     Relevant past medical, surgical, family and social history reviewed and updated as indicated. Interim medical history since our last visit reviewed. Allergies and medications reviewed and updated.  Review of Systems  HENT: Positive for ear pain.        Nostril pain, mass behind ear (not present today)  Musculoskeletal: Positive for neck pain.    Per HPI unless specifically indicated above     Objective:    BP 125/90   Pulse 75   Temp 98.5 F (36.9 C)   Wt 260 lb (117.9 kg)   SpO2 100%   BMI 38.40 kg/m   Wt Readings from Last 3 Encounters:  03/03/21 260 lb (117.9 kg)  08/24/20 250 lb (113.4 kg)  07/10/20 252 lb (114.3 kg)    Physical Exam Vitals and nursing note reviewed.  Constitutional:      General: He is not in acute distress.    Appearance: Normal appearance.  He is not ill-appearing, toxic-appearing or diaphoretic.  HENT:     Head: Normocephalic.     Right Ear: Tympanic membrane, ear canal and external ear normal.     Left Ear: Tympanic membrane, ear canal and external ear normal.     Nose: Nose normal. No congestion or rhinorrhea.      Mouth/Throat:     Mouth: Mucous membranes are moist.  Eyes:     General:        Right eye: No discharge.        Left eye: No discharge.     Extraocular Movements: Extraocular movements intact.     Conjunctiva/sclera: Conjunctivae normal.     Pupils: Pupils are equal, round, and reactive to light.  Neck:   Cardiovascular:     Rate and Rhythm: Normal rate and regular rhythm.     Heart sounds: No murmur heard.   Pulmonary:     Effort: Pulmonary effort is normal. No respiratory distress.     Breath sounds: Normal breath sounds. No wheezing, rhonchi or rales.  Abdominal:     General: Abdomen is flat. Bowel  sounds are normal.  Musculoskeletal:     Cervical back: Full passive range of motion without pain, normal range of motion and neck supple.  Lymphadenopathy:     Cervical: No cervical adenopathy.  Skin:    General: Skin is warm and dry.     Capillary Refill: Capillary refill takes less than 2 seconds.  Neurological:     General: No focal deficit present.     Mental Status: He is alert and oriented to person, place, and time.  Psychiatric:        Mood and Affect: Mood normal.        Behavior: Behavior normal.        Thought Content: Thought content normal.        Judgment: Judgment normal.      Assessment & Plan:   Problem List Items Addressed This Visit   None   Visit Diagnoses    Right ear pain    -  Primary   Recommend patient see ENT for evaluation and treatment of ear pain and neck mass.   Relevant Orders   Ambulatory referral to ENT       Follow up plan: Return in about 3 months (around 06/03/2021) for Physical and Fasting labs.  A total of 20 minutes were spent on this  encounter today.  When total time is documented, this includes both the face-to-face and non-face-to-face time personally spent before, during and after the visit on the date of the encounter.

## 2021-03-10 ENCOUNTER — Ambulatory Visit (HOSPITAL_COMMUNITY)
Admission: RE | Admit: 2021-03-10 | Discharge: 2021-03-10 | Disposition: A | Discharge status: Home / Self Care | Payer: Managed Care, Other (non HMO) | Source: Ambulatory Visit | Attending: Urology | Admitting: Urology

## 2021-03-10 ENCOUNTER — Other Ambulatory Visit: Payer: Self-pay

## 2021-03-10 DIAGNOSIS — R102 Pelvic and perineal pain: Secondary | ICD-10-CM | POA: Insufficient documentation

## 2021-03-14 ENCOUNTER — Telehealth: Payer: Self-pay | Admitting: Urology

## 2021-03-14 NOTE — Telephone Encounter (Signed)
Pelvic ultrasound showed no abnormalities.  His last office visit was September 2021.  Is he still having symptoms?

## 2021-03-15 ENCOUNTER — Encounter: Payer: Self-pay | Admitting: *Deleted

## 2021-03-17 ENCOUNTER — Other Ambulatory Visit: Payer: Self-pay | Admitting: Otolaryngology

## 2021-03-17 DIAGNOSIS — M542 Cervicalgia: Secondary | ICD-10-CM

## 2021-03-25 ENCOUNTER — Encounter: Payer: Self-pay | Admitting: *Deleted

## 2021-03-25 ENCOUNTER — Telehealth: Payer: Self-pay | Admitting: Urology

## 2021-03-25 NOTE — Telephone Encounter (Signed)
Notified patient as instructed,. Sent to my chart also

## 2021-03-25 NOTE — Telephone Encounter (Signed)
In response to patient's MyChart message: Pelvic floor physical therapy would be the most successful option.  We could give a trial of amitriptyline for 1 month which may possibly improve his symptoms.  We had also discussed cystoscopy to evaluate for the possibility of urethral stricture however we canceled due to some symptom improvement.  This can all place be rescheduled to evaluate for possible cause of his symptoms.

## 2021-04-06 ENCOUNTER — Other Ambulatory Visit: Payer: Self-pay

## 2021-04-06 ENCOUNTER — Ambulatory Visit
Admission: RE | Admit: 2021-04-06 | Discharge: 2021-04-06 | Disposition: A | Discharge status: Home / Self Care | Payer: Managed Care, Other (non HMO) | Source: Ambulatory Visit | Attending: Otolaryngology | Admitting: Otolaryngology

## 2021-04-06 DIAGNOSIS — M542 Cervicalgia: Secondary | ICD-10-CM | POA: Diagnosis present

## 2021-04-06 MED ORDER — IOHEXOL 300 MG/ML  SOLN
75.0000 mL | Freq: Once | INTRAMUSCULAR | Status: AC | PRN
  Administered 2021-04-06: 75 mL via INTRAVENOUS

## 2021-04-07 ENCOUNTER — Encounter: Payer: Self-pay | Admitting: Urology

## 2021-04-07 ENCOUNTER — Ambulatory Visit: Payer: Managed Care, Other (non HMO) | Admitting: Urology

## 2021-04-07 VITALS — BP 130/74 | HR 84 | Ht 70.0 in | Wt 260.0 lb

## 2021-04-07 DIAGNOSIS — G894 Chronic pain syndrome: Secondary | ICD-10-CM

## 2021-04-07 DIAGNOSIS — N411 Chronic prostatitis: Secondary | ICD-10-CM

## 2021-04-07 NOTE — Progress Notes (Signed)
   04/07/21  CC:  Chief Complaint  Patient presents with  . Cysto    HPI: 35 y.o. male with chronic prostatitis/chronic pelvic pain syndrome.  Symptoms have been persistent.  He presents for cystoscopy to rule out stricture or obvious abnormalities which could account for his symptoms and if unremarkable is interested in pursuing pelvic floor physical therapy  Blood pressure 130/74, pulse 84, height 5\' 10"  (1.778 m), weight 260 lb (117.9 kg). NED. A&Ox3.   No respiratory distress   Abd soft, NT, ND Normal phallus with bilateral descended testicles  Cystoscopy Procedure Note  Patient identification was confirmed, informed consent was obtained, and patient was prepped using Betadine solution.  Lidocaine jelly was administered per urethral meatus.     Pre-Procedure: - Inspection reveals a normal caliber urethral meatus.  Procedure: The flexible cystoscope was introduced without difficulty - No urethral strictures/lesions are present. - Moderate lateral lobe enlargement prostate  - Moderate elevation bladder neck - Bilateral ureteral orifices identified - Bladder mucosa  reveals no ulcers, tumors, or lesions - No bladder stones - No trabeculation  Retroflexion shows no intravesical median lobe or other abnormalities   Post-Procedure: - Patient tolerated the procedure well  Assessment/ Plan:  No significant anatomic abnormality/stricture on cystoscopy  Prostate enlarged for age  He desires referral for pelvic floor physical therapy   Abbie Sons, MD

## 2021-05-10 ENCOUNTER — Other Ambulatory Visit: Payer: Self-pay | Admitting: Neurology

## 2021-05-10 DIAGNOSIS — M436 Torticollis: Secondary | ICD-10-CM

## 2021-05-20 ENCOUNTER — Ambulatory Visit
Admission: RE | Admit: 2021-05-20 | Discharge: 2021-05-20 | Disposition: A | Discharge status: Home / Self Care | Payer: Managed Care, Other (non HMO) | Source: Ambulatory Visit | Attending: Neurology | Admitting: Neurology

## 2021-05-20 ENCOUNTER — Other Ambulatory Visit: Payer: Self-pay

## 2021-05-20 DIAGNOSIS — M436 Torticollis: Secondary | ICD-10-CM | POA: Diagnosis present

## 2021-05-20 MED ORDER — GADOBUTROL 1 MMOL/ML IV SOLN
10.0000 mL | Freq: Once | INTRAVENOUS | Status: AC | PRN
  Administered 2021-05-20: 10 mL via INTRAVENOUS

## 2021-06-02 NOTE — Progress Notes (Signed)
BP 124/70   Pulse 99   Temp 98.1 F (36.7 C) (Oral)   Ht 5\' 10"  (1.778 m)   Wt 269 lb 6.4 oz (122.2 kg)   SpO2 96%   BMI 38.65 kg/m    Subjective:    Patient ID: Antonio Mueller, male    DOB: October 20, 1986, 35 y.o.   MRN: [id number]  HPI: Antonio Mueller is a 35 y.o. male presenting on 06/03/2021 for comprehensive medical examination. Current medical complaints include:none  He currently lives with: Interim Problems from his last visit: no   Denies HA, CP, SOB, dizziness, palpitations, visual changes, and lower extremity swelling.  Depression Screen done today and results listed below:  Depression screen [facility] 2/9 06/03/2021 03/03/2021 11/27/2019 08/20/2018 02/14/2018  Decreased Interest 0 0 0 0 0  Down, Depressed, Hopeless 0 0 0 0 0  PHQ - 2 Score 0 0 0 0 0  Altered sleeping - - 0 - -  Tired, decreased energy - - 1 - -  Change in appetite - - 0 - -  Feeling bad or failure about yourself  - - 0 - -  Trouble concentrating - - 0 - -  Moving slowly or fidgety/restless - - 0 - -  Suicidal thoughts - - 0 - -  PHQ-9 Score - - 1 - -    The patient does not have a history of falls. I did complete a risk assessment for falls. A plan of care for falls was documented.   Past Medical History:  Past Medical History:  Diagnosis Date   Colon polyp    Inflammatory bowel diseases (IBD)    Lichen striatus    Lyme disease    Migraine headache     Surgical History:  Past Surgical History:  Procedure Laterality Date   COLONOSCOPY  03/05/2018   Procedure: COLONOSCOPY;  Surgeon: Lin Landsman, MD;  Location: [facility] ENDOSCOPY;  Service: Gastroenterology;;   COLONOSCOPY W/ POLYPECTOMY     [place]     2 as a child    Medications:  Current Outpatient Medications on File Prior to Visit  Medication Sig   Alpha-Lipoic Acid 300 MG CAPS Take 1,200 mg by mouth daily.   bifidobacterium infantis (ALIGN) capsule Take 1 capsule by mouth daily.   FIBER PO Take 5  capsules by mouth 3 (three) times daily.   Multiple Vitamins-Minerals (MULTIVITAMIN WITH MINERALS) tablet Take 1 tablet by mouth daily.   Probiotic Product (DIGESTIVE ADVANTAGE PO) Take by mouth.   VITAMIN D PO Take by mouth daily.   No current facility-administered medications on file prior to visit.    Allergies:  Allergies  Allergen Reactions   Propofol     Other reaction(s): Other (See Comments) Memory problems   Nickel Rash    Social History:  Social History   Socioeconomic History   Marital status: Married    Spouse name: Not on file   Number of children: Not on file   Years of education: Not on file   Highest education level: Not on file  Occupational History   Not on file  Tobacco Use   Smoking status: Never   Smokeless tobacco: Never  Vaping Use   Vaping Use: Never used  Substance and Sexual Activity   Alcohol use: Not Currently   Drug use: No   Sexual activity: Yes    Birth control/protection: Condom  Other Topics Concern   Not on file  Social History Narrative  Not on file   Social Determinants of Health   Financial Resource Strain: Not on file  Food Insecurity: Not on file  Transportation Needs: Not on file  Physical Activity: Not on file  Stress: Not on file  Social Connections: Not on file  Intimate Partner Violence: Not on file   Social History   Tobacco Use  Smoking Status Never  Smokeless Tobacco Never   Social History   Substance and Sexual Activity  Alcohol Use Not Currently    Family History:  Family History  Problem Relation Age of Onset   Thyroid disease Mother    Cancer Mother        Basal Cell Carinoma   Hepatitis C Father    Arthritis Father        Rheumatoid   Cancer Maternal Grandmother        Breast   Cancer Maternal Grandfather        bladder   Heart disease Maternal Grandfather    Alzheimer's disease Maternal Grandfather    Alzheimer's disease Paternal Grandmother    Heart disease Paternal Grandfather     Congestive Heart Failure Paternal Grandfather    Parkinson's disease Paternal Grandfather    Crohn's disease Cousin    Cancer Other        Colon Cancer   Cancer Other        Colon    Past medical history, surgical history, medications, allergies, family history and social history reviewed with patient today and changes made to appropriate areas of the chart.   Review of Systems  Eyes:  Negative for blurred vision and double vision.  Respiratory:  Negative for shortness of breath.   Cardiovascular:  Negative for chest pain, palpitations and leg swelling.  Neurological:  Negative for dizziness and headaches.  All other ROS negative except what is listed above and in the HPI.      Objective:    BP 124/70   Pulse 99   Temp 98.1 F (36.7 C) (Oral)   Ht 5\' 10"  (1.778 m)   Wt 269 lb 6.4 oz (122.2 kg)   SpO2 96%   BMI 38.65 kg/m   Wt Readings from Last 3 Encounters:  06/03/21 269 lb 6.4 oz (122.2 kg)  04/07/21 260 lb (117.9 kg)  03/03/21 260 lb (117.9 kg)    Physical Exam Vitals and nursing note reviewed.  Constitutional:      General: He is not in acute distress.    Appearance: Normal appearance. He is obese. He is not ill-appearing, toxic-appearing or diaphoretic.  HENT:     Head: Normocephalic.     Right Ear: Tympanic membrane, ear canal and external ear normal.     Left Ear: Tympanic membrane, ear canal and external ear normal.     Nose: Nose normal. No congestion or rhinorrhea.     Mouth/Throat:     Mouth: Mucous membranes are moist.  Eyes:     General:        Right eye: No discharge.        Left eye: No discharge.     Extraocular Movements: Extraocular movements intact.     Conjunctiva/sclera: Conjunctivae normal.     Pupils: Pupils are equal, round, and reactive to light.  Cardiovascular:     Rate and Rhythm: Normal rate and regular rhythm.     Heart sounds: No murmur heard. Pulmonary:     Effort: Pulmonary effort is normal. No respiratory distress.      Breath sounds:  Normal breath sounds. No wheezing, rhonchi or rales.  Abdominal:     General: Abdomen is flat. Bowel sounds are normal. There is no distension.     Palpations: Abdomen is soft.     Tenderness: There is no abdominal tenderness. There is no guarding.  Musculoskeletal:     Cervical back: Normal range of motion and neck supple.  Skin:    General: Skin is warm and dry.     Capillary Refill: Capillary refill takes less than 2 seconds.  Neurological:     General: No focal deficit present.     Mental Status: He is alert and oriented to person, place, and time.     Cranial Nerves: No cranial nerve deficit.     Motor: No weakness.     Deep Tendon Reflexes: Reflexes normal.  Psychiatric:        Mood and Affect: Mood normal.        Behavior: Behavior normal.        Thought Content: Thought content normal.        Judgment: Judgment normal.    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 SKIN, ANTERIOR NECK, BIOPSY: IRRITATED VERRUCA. BASE INVOLVED.  MICROSCOPIC DESCRIPTION: There is an exo-endophytic proliferation of keratinocytes associated with hypergranulosis and compact ortho and parakeratosis.  The findings are those of an irritated verruca with a prominent endophytic component but there is no significant atypia.   GROSS DESCRIPTION:  The specimen is received in formalin and consists of a 0.9 x 0.6 x 0.1 cm shave of tan skin, with a 0.3 x 0.3 cm tan slightly crusted nodule. Resection margin is inked black, and the specimen is bisected and entirely submitted in 1 cassette. Craig Staggers 07/16/2020)    Final Diagnosis performed by Vallarie Mare, MD.   Electronically signed  07/16/2020 Technical component performed at [employer]. [facility], [place] [address], [place], [place] [address].  Professional component performed at [facility]. [address], [place], [place], [place] [address].  Immunohistochemistry Technical component (if applicable) was performed at [facility]. [address] [place], [place], [place] [address].   IMMUNOHISTOCHEMISTRY DISCLAIMER (if applicable): Some of these immunohistochemical stains may have been developed and the performance characteristics determine by [facility]. Some may not have been cleared or approved by the U.S. Food and Drug Administration. The FDA has determined that such clearance or approval is not necessary. This test is used for clinical purposes. It should not be regarded as investigational or for research. This laboratory is certified under the [place] (CLIA-88) as qualified to perform high complexity clinical laboratory testing.  The controls stained appropriately.       Assessment & Plan:   Problem List Items Addressed This Visit       Other   Vitamin D deficiency   Relevant Orders   Vitamin D (25 hydroxy)   Other Visit Diagnoses     Annual physical exam    -  Primary   Health Maintenance reviewed during visit. Labs ordered today. HPV given.    Relevant Orders   TSH   Lipid panel   CBC with Differential/Platelet   Comprehensive  metabolic panel   Urinalysis, Routine w reflex microscopic   Encounter for hepatitis C screening test for low risk patient       Relevant Orders   Hepatitis C Antibody        Discussed aspirin prophylaxis for myocardial infarction prevention and decision was it was not indicated  LABORATORY TESTING:  Health maintenance labs ordered today as discussed above.   The natural history of prostate cancer and ongoing controversy regarding screening and potential  treatment outcomes of prostate cancer has been discussed with the patient. The meaning of a false positive PSA and a false negative PSA has been discussed. He indicates understanding of the limitations of this screening test and wishes not to proceed with screening PSA testing.   IMMUNIZATIONS:   - Tdap: Tetanus vaccination status reviewed: last tetanus booster within 10 years. - Influenza: Postponed to flu season - Pneumovax: Administered today - Prevnar: Administered today - HPV: Administered today - Zostavax vaccine: Not applicable  SCREENING: - Colonoscopy: Not applicable  Discussed with patient purpose of the colonoscopy is to detect colon cancer at curable precancerous or early stages   - AAA Screening: Not applicable  -Hearing Test: Not applicable  -Spirometry: Not applicable   PATIENT COUNSELING:    Sexuality: Discussed sexually transmitted diseases, partner selection, use of condoms, avoidance of unintended pregnancy  and contraceptive alternatives.   Advised to avoid cigarette smoking.  I discussed with the patient that most people either abstain from alcohol or drink within safe limits (<=14/week and <=4 drinks/occasion for males, <=7/weeks and <= 3 drinks/occasion for females) and that the risk for alcohol disorders and other health effects rises proportionally with the number of drinks per week and how often a drinker exceeds daily limits.  Discussed cessation/primary prevention of drug use and availability of treatment for abuse.   Diet: Encouraged to adjust caloric intake to maintain  or achieve ideal body weight, to reduce intake of dietary saturated fat and total fat, to limit sodium intake by avoiding high sodium foods and not adding table salt, and to maintain adequate dietary potassium and calcium preferably from fresh fruits, vegetables, and low-fat dairy products.    stressed the importance of regular exercise  Injury prevention: Discussed safety belts, safety  helmets, smoke detector, smoking near bedding or upholstery.   Dental health: Discussed importance of regular tooth brushing, flossing, and dental visits.   Follow up plan: NEXT PREVENTATIVE PHYSICAL DUE IN 1 YEAR. Return in about 1 month (around 07/03/2021).

## 2021-06-03 ENCOUNTER — Ambulatory Visit (INDEPENDENT_AMBULATORY_CARE_PROVIDER_SITE_OTHER): Payer: Managed Care, Other (non HMO) | Admitting: Nurse Practitioner

## 2021-06-03 ENCOUNTER — Encounter: Payer: Self-pay | Admitting: Nurse Practitioner

## 2021-06-03 ENCOUNTER — Other Ambulatory Visit: Payer: Self-pay

## 2021-06-03 VITALS — BP 124/70 | HR 99 | Temp 98.1°F | Ht 70.0 in | Wt 269.4 lb

## 2021-06-03 DIAGNOSIS — Z0000 Encounter for general adult medical examination without abnormal findings: Secondary | ICD-10-CM

## 2021-06-03 DIAGNOSIS — Z1159 Encounter for screening for other viral diseases: Secondary | ICD-10-CM

## 2021-06-03 DIAGNOSIS — Z23 Encounter for immunization: Secondary | ICD-10-CM | POA: Diagnosis not present

## 2021-06-03 DIAGNOSIS — E559 Vitamin D deficiency, unspecified: Secondary | ICD-10-CM | POA: Diagnosis not present

## 2021-06-03 LAB — URINALYSIS, ROUTINE W REFLEX MICROSCOPIC
Bilirubin, UA: NEGATIVE
Glucose, UA: NEGATIVE
Ketones, UA: NEGATIVE
Leukocytes,UA: NEGATIVE
Nitrite, UA: NEGATIVE
Protein,UA: NEGATIVE
Specific Gravity, UA: 1.015 (ref 1.005–1.030)
Urobilinogen, Ur: 0.2 mg/dL (ref 0.2–1.0)
pH, UA: 6 (ref 5.0–7.5)

## 2021-06-03 LAB — MICROSCOPIC EXAMINATION
Bacteria, UA: NONE SEEN
WBC, UA: NONE SEEN /hpf (ref 0–5)

## 2021-06-04 LAB — COMPREHENSIVE METABOLIC PANEL
ALT: 54 IU/L — ABNORMAL HIGH (ref 0–44)
AST: 29 IU/L (ref 0–40)
Albumin/Globulin Ratio: 2 (ref 1.2–2.2)
Albumin: 4.7 g/dL (ref 4.0–5.0)
Alkaline Phosphatase: 68 IU/L (ref 44–121)
BUN/Creatinine Ratio: 7 — ABNORMAL LOW (ref 9–20)
BUN: 7 mg/dL (ref 6–20)
Bilirubin Total: 0.4 mg/dL (ref 0.0–1.2)
CO2: 22 mmol/L (ref 20–29)
Calcium: 10 mg/dL (ref 8.7–10.2)
Chloride: 101 mmol/L (ref 96–106)
Creatinine, Ser: 0.97 mg/dL (ref 0.76–1.27)
Globulin, Total: 2.3 g/dL (ref 1.5–4.5)
Glucose: 109 mg/dL — ABNORMAL HIGH (ref 65–99)
Potassium: 4.5 mmol/L (ref 3.5–5.2)
Sodium: 139 mmol/L (ref 134–144)
Total Protein: 7 g/dL (ref 6.0–8.5)
eGFR: 104 mL/min/{1.73_m2} (ref >59)

## 2021-06-04 LAB — LIPID PANEL
Chol/HDL Ratio: 4.8 ratio (ref 0.0–5.0)
Cholesterol, Total: 223 mg/dL — ABNORMAL HIGH (ref 100–199)
HDL: 46 mg/dL (ref >39)
LDL Chol Calc (NIH): 132 mg/dL — ABNORMAL HIGH (ref 0–99)
Triglycerides: 254 mg/dL — ABNORMAL HIGH (ref 0–149)
VLDL Cholesterol Cal: 45 mg/dL — ABNORMAL HIGH (ref 5–40)

## 2021-06-04 LAB — CBC WITH DIFFERENTIAL/PLATELET
Basophils Absolute: 0.1 10*3/uL (ref 0.0–0.2)
Basos: 1 %
EOS (ABSOLUTE): 0.2 10*3/uL (ref 0.0–0.4)
Eos: 3 %
Hematocrit: 47.8 % (ref 37.5–51.0)
Hemoglobin: 16.3 g/dL (ref 13.0–17.7)
Immature Grans (Abs): 0 10*3/uL (ref 0.0–0.1)
Immature Granulocytes: 0 %
Lymphocytes Absolute: 1.9 10*3/uL (ref 0.7–3.1)
Lymphs: 25 %
MCH: 29.5 pg (ref 26.6–33.0)
MCHC: 34.1 g/dL (ref 31.5–35.7)
MCV: 87 fL (ref 79–97)
Monocytes Absolute: 0.6 10*3/uL (ref 0.1–0.9)
Monocytes: 8 %
Neutrophils Absolute: 4.8 10*3/uL (ref 1.4–7.0)
Neutrophils: 63 %
Platelets: 261 10*3/uL (ref 150–450)
RBC: 5.52 x10E6/uL (ref 4.14–5.80)
RDW: 12.1 % (ref 11.6–15.4)
WBC: 7.5 10*3/uL (ref 3.4–10.8)

## 2021-06-04 LAB — VITAMIN D 25 HYDROXY (VIT D DEFICIENCY, FRACTURES): Vit D, 25-Hydroxy: 44.6 ng/mL (ref 30.0–100.0)

## 2021-06-04 LAB — HEPATITIS C ANTIBODY: Hep C Virus Ab: 0.5 s/co ratio (ref 0.0–0.9)

## 2021-06-04 LAB — TSH: TSH: 3.28 u[IU]/mL (ref 0.450–4.500)

## 2021-06-08 NOTE — Progress Notes (Signed)
Hi Antonio Mueller.  Your lab work shows that your cholesterol is elevated from prior.  I recommend a low fat diet and exercising 5x weekly for at least 30 minutes.  Otherwise your lab work looks good.  We should repeat your cholesterol labs in 6 months and possibly discuss medication at that time if it remains elevated. Please let me know if you have any questions.

## 2021-06-28 ENCOUNTER — Other Ambulatory Visit: Payer: Self-pay

## 2021-06-28 ENCOUNTER — Encounter: Payer: Self-pay | Admitting: *Deleted

## 2021-06-28 ENCOUNTER — Emergency Department
Admission: EM | Admit: 2021-06-28 | Discharge: 2021-06-29 | Disposition: A | Discharge status: Home / Self Care | Payer: Managed Care, Other (non HMO) | Attending: Emergency Medicine | Admitting: Emergency Medicine

## 2021-06-28 DIAGNOSIS — Z23 Encounter for immunization: Secondary | ICD-10-CM | POA: Diagnosis not present

## 2021-06-28 DIAGNOSIS — W540XXA Bitten by dog, initial encounter: Secondary | ICD-10-CM | POA: Insufficient documentation

## 2021-06-28 DIAGNOSIS — Z2914 Encounter for prophylactic rabies immune globin: Secondary | ICD-10-CM | POA: Diagnosis not present

## 2021-06-28 DIAGNOSIS — Z203 Contact with and (suspected) exposure to rabies: Secondary | ICD-10-CM | POA: Insufficient documentation

## 2021-06-28 DIAGNOSIS — S6991XA Unspecified injury of right wrist, hand and finger(s), initial encounter: Secondary | ICD-10-CM | POA: Diagnosis present

## 2021-06-28 DIAGNOSIS — S61451A Open bite of right hand, initial encounter: Secondary | ICD-10-CM | POA: Diagnosis not present

## 2021-06-28 MED ORDER — TETANUS-DIPHTH-ACELL PERTUSSIS 5-2.5-18.5 LF-MCG/0.5 IM SUSY
0.5000 mL | PREFILLED_SYRINGE | Freq: Once | INTRAMUSCULAR | Status: AC
  Administered 2021-06-29: 0.5 mL via INTRAMUSCULAR
  Filled 2021-06-28: qty 0.5

## 2021-06-28 MED ORDER — AMOXICILLIN-POT CLAVULANATE 875-125 MG PO TABS: 1.0000 | ORAL_TABLET | Freq: Two times a day (BID) | ORAL | 0 refills | Status: AC

## 2021-06-28 MED ORDER — RABIES VACCINE, PCEC IM SUSR
1.0000 mL | Freq: Once | INTRAMUSCULAR | Status: AC
  Administered 2021-06-29: 1 mL via INTRAMUSCULAR
  Filled 2021-06-28: qty 1

## 2021-06-28 MED ORDER — RABIES IMMUNE GLOBULIN 150 UNIT/ML IM INJ
20.0000 [IU]/kg | INJECTION | Freq: Once | INTRAMUSCULAR | Status: AC
  Administered 2021-06-29: 2400 [IU] via INTRAMUSCULAR
  Filled 2021-06-28: qty 16

## 2021-06-28 NOTE — ED Notes (Signed)
RN called [employer] Department non emergency number and reported dog bit for this patient.  Await return call.

## 2021-06-28 NOTE — ED Triage Notes (Signed)
Pt has dog bite to right hand and abrasions to left arm.   Bleeding controlled  Pt alert

## 2021-06-28 NOTE — Discharge Instructions (Addendum)
Please return on July 28th August 1st and August 8th for subsequent rabies vaccines.

## 2021-06-28 NOTE — ED Provider Notes (Signed)
[facility]-EMERGENCY DEPARTMENT  ____________________________________________  Time seen: Approximately 11:03 PM  I have reviewed the triage vital signs and the nursing notes.   HISTORY  Chief Complaint Animal Bite   Historian Patient     HPI Antonio Mueller is a 35 y.o. male presents to the emergency department after he was bitten by a stray dog at his apartment complex.  Dog is available to be quarantined for signs and symptoms of rabies.  Patient has superficial bite wound along the dorsal aspect of the right hand and multiple scratches on the bilateral forearms.  Patient reports that he washed his hands and forearms with soap and water before coming to the emergency department.  He cannot recall his last tetanus shot.  No other alleviating measures have been attempted.   Past Medical History:  Diagnosis Date   Colon polyp    Inflammatory bowel diseases (IBD)    Lichen striatus    Lyme disease    Migraine headache      Immunizations up to date:  Yes.     Past Medical History:  Diagnosis Date   Colon polyp    Inflammatory bowel diseases (IBD)    Lichen striatus    Lyme disease    Migraine headache     Patient Active Problem List   Diagnosis Date Noted   Vitamin D deficiency 10/17/2019   Chest pain 07/24/2019   Weakness of extremity 08/20/2018   Rectal bleeding [id number]   Lichen striatus [id number]    Past Surgical History:  Procedure Laterality Date   COLONOSCOPY  03/05/2018   Procedure: COLONOSCOPY;  Surgeon: Lin Landsman, MD;  Location: [facility] ENDOSCOPY;  Service: Gastroenterology;;   COLONOSCOPY W/ POLYPECTOMY     [place]     2 as a child    Prior to Admission medications   Medication Sig Start Date End Date Taking? Authorizing Provider  amoxicillin-clavulanate (AUGMENTIN) 875-125 MG tablet Take 1 tablet by mouth 2 (two) times daily for 10 days. 06/28/21 07/08/21 Yes Vallarie Mare M, PA-C  Alpha-Lipoic Acid 300 MG  CAPS Take 1,200 mg by mouth daily.    [provider]  bifidobacterium infantis (ALIGN) capsule Take 1 capsule by mouth daily.    [provider]  FIBER PO Take 5 capsules by mouth 3 (three) times daily.    [provider]  Multiple Vitamins-Minerals (MULTIVITAMIN WITH MINERALS) tablet Take 1 tablet by mouth daily.    [provider]  Probiotic Product (DIGESTIVE ADVANTAGE PO) Take by mouth.    [provider]  VITAMIN D PO Take by mouth daily.    [provider]    Allergies Propofol and Nickel  Family History  Problem Relation Age of Onset   Thyroid disease Mother    Cancer Mother        Basal Cell Carinoma   Hepatitis C Father    Arthritis Father        Rheumatoid   Cancer Maternal Grandmother        Breast   Cancer Maternal Grandfather        bladder   Heart disease Maternal Grandfather    Alzheimer's disease Maternal Grandfather    Alzheimer's disease Paternal Grandmother    Heart disease Paternal Grandfather    Congestive Heart Failure Paternal Grandfather    Parkinson's disease Paternal Grandfather    Crohn's disease Cousin    Cancer Other        Colon Cancer  Cancer Other        Colon    Social History Social History   Tobacco Use   Smoking status: Never   Smokeless tobacco: Never  Vaping Use   Vaping Use: Never used  Substance Use Topics   Alcohol use: Not Currently   Drug use: No     Review of Systems  Constitutional: No fever/chills Eyes:  No discharge ENT: No upper respiratory complaints. Respiratory: no cough. No SOB/ use of accessory muscles to breath Gastrointestinal:   No nausea, no vomiting.  No diarrhea.  No constipation. Musculoskeletal: Negative for musculoskeletal pain. Skin: Patient has dog bite wound.     ____________________________________________   PHYSICAL EXAM:  VITAL SIGNS: ED Triage Vitals  Enc Vitals Group     BP 06/28/21 2202 (!) 153/100     Pulse Rate 06/28/21  2202 90     Resp 06/28/21 2202 18     Temp 06/28/21 2202 99.4 F (37.4 C)     Temp Source 06/28/21 2202 Oral     SpO2 06/28/21 2202 96 %     Weight 06/28/21 2201 265 lb (120.2 kg)     Height 06/28/21 2201 '5\' 10"'$  (1.778 m)     Head Circumference --      Peak Flow --      Pain Score 06/28/21 2200 1     Pain Loc --      Pain Edu? --      Excl. in [place]? --      Constitutional: Alert and oriented. Well appearing and in no acute distress. Eyes: Conjunctivae are normal. PERRL. EOMI. Head: Atraumatic. ENT: Cardiovascular: Normal rate, regular rhythm. Normal S1 and S2.  Good peripheral circulation. Respiratory: Normal respiratory effort without tachypnea or retractions. Lungs CTAB. Good air entry to the bases with no decreased or absent breath sounds Gastrointestinal: Bowel sounds x 4 quadrants. Soft and nontender to palpation. No guarding or rigidity. No distention. Musculoskeletal: Full range of motion to all extremities. No obvious deformities noted Neurologic:  Normal for age. No gross focal neurologic deficits are appreciated.  Skin: Patient has superficial dog bite wound along the dorsal aspect of the right hand and multiple scratches on the bilateral forearms. Psychiatric: Mood and affect are normal for age. Speech and behavior are normal.   ____________________________________________   LABS (all labs ordered are listed, but only abnormal results are displayed)  Labs Reviewed - No data to display ____________________________________________  EKG   ____________________________________________  RADIOLOGY   No results found.  ____________________________________________    PROCEDURES  Procedure(s) performed:     Procedures     Medications  Tdap (BOOSTRIX) injection 0.5 mL (has no administration in time range)  rabies immune globulin (HYPERAB/KEDRAB) injection 2,400 Units (has no administration in time range)  rabies vaccine (RABAVERT) injection 1 mL (has no  administration in time range)     ____________________________________________   INITIAL IMPRESSION / ASSESSMENT AND PLAN / ED COURSE  Pertinent labs & imaging results that were available during my care of the patient were reviewed by me and considered in my medical decision making (see chart for details).      Assessment and Plan:  Dog bite wound:  35 year old male presents to the emergency department after he was bitten by a stray dog.  Patient was hypertensive at triage but vital signs were otherwise reassuring.  Patient elected to initiate rabies vaccine series.  He was advised to return to [facility] urgent care for subsequent rabies vaccines 3,  7 and 14 days from today.  He was discharged with Augmentin to be taken twice daily for the next 10 days and his tetanus status was updated prior to discharge.    ____________________________________________  FINAL CLINICAL IMPRESSION(S) / ED DIAGNOSES  Final diagnoses:  Dog bite, initial encounter      NEW MEDICATIONS STARTED DURING THIS VISIT:  ED Discharge Orders          Ordered    amoxicillin-clavulanate (AUGMENTIN) 875-125 MG tablet  2 times daily        06/28/21 2317                This chart was dictated using voice recognition software/Dragon. Despite best efforts to proofread, errors can occur which can change the meaning. Any change was purely unintentional.     Lannie Fields, PA-C 06/28/21 2324    Duffy Bruce, MD 06/29/21 [phone]

## 2021-06-28 NOTE — ED Notes (Signed)
Report given to Armanda Heritage RN

## 2021-06-30 NOTE — ED Notes (Signed)
Patient left a message last night saying the dog was in quarantine, but it got away.

## 2021-07-01 ENCOUNTER — Ambulatory Visit
Admission: RE | Admit: 2021-07-01 | Discharge: 2021-07-01 | Disposition: A | Discharge status: Home / Self Care | Payer: Managed Care, Other (non HMO) | Source: Ambulatory Visit | Attending: Emergency Medicine | Admitting: Emergency Medicine

## 2021-07-01 ENCOUNTER — Other Ambulatory Visit: Payer: Self-pay

## 2021-07-01 DIAGNOSIS — Z203 Contact with and (suspected) exposure to rabies: Secondary | ICD-10-CM

## 2021-07-01 MED ORDER — RABIES VACCINE, PCEC IM SUSR
1.0000 mL | Freq: Once | INTRAMUSCULAR | Status: AC
  Administered 2021-07-01: 1 mL via INTRAMUSCULAR

## 2021-07-01 NOTE — ED Triage Notes (Signed)
Nurse visit here for Rabies vaccine Day 3 (shot 2)

## 2021-07-02 ENCOUNTER — Telehealth: Payer: Self-pay

## 2021-07-02 NOTE — Telephone Encounter (Signed)
It is fine to get the HPV vaccine.

## 2021-07-02 NOTE — Telephone Encounter (Signed)
Patient aware, will keep nurse visit scheduled.

## 2021-07-02 NOTE — Telephone Encounter (Signed)
Copied from [place] [id number]. Topic: General - Call Back - No Documentation >> Jul 02, 2021 11:07 AM Erick Blinks wrote: Reason for CRM: Pt called reporting that he is due for his second series of his HPV vaccines, he has also recently been bit by a stray dog. So now he is having to get vaccinated for tetanus and rabies. Pt wants to know if he should adjust his HPV vaccine scheduling to accommodate. Please advise   Best contact: [phone]   Routing to provider to advise. Should patient wait to get HPV vaccines since he is having to get a tetanus and rabies?

## 2021-07-02 NOTE — Telephone Encounter (Signed)
Copied from [place] [id number]. Topic: General - Call Back - No Documentation >> Jul 02, 2021 11:07 AM Erick Blinks wrote: Reason for CRM: Pt called reporting that he is due for his second series of his HPV vaccines, he has also recently been bit by a stray dog. So now he is having to get vaccinated for tetanus and rabies. Pt wants to know if he should adjust his HPV vaccine scheduling to accommodate. Please advise   Best contact: [phone]

## 2021-07-05 ENCOUNTER — Other Ambulatory Visit: Payer: Self-pay

## 2021-07-05 ENCOUNTER — Ambulatory Visit
Admission: RE | Admit: 2021-07-05 | Discharge: 2021-07-05 | Disposition: A | Discharge status: Home / Self Care | Payer: Managed Care, Other (non HMO) | Source: Ambulatory Visit | Attending: Emergency Medicine | Admitting: Emergency Medicine

## 2021-07-05 DIAGNOSIS — Z203 Contact with and (suspected) exposure to rabies: Secondary | ICD-10-CM | POA: Diagnosis not present

## 2021-07-05 MED ORDER — RABIES VACCINE, PCEC IM SUSR
1.0000 mL | Freq: Once | INTRAMUSCULAR | Status: AC
  Administered 2021-07-05: 1 mL via INTRAMUSCULAR

## 2021-07-05 NOTE — ED Triage Notes (Signed)
Patient presents for 3rd Rabies vaccination.   Patient has no complaints.

## 2021-07-07 ENCOUNTER — Ambulatory Visit (INDEPENDENT_AMBULATORY_CARE_PROVIDER_SITE_OTHER): Payer: Managed Care, Other (non HMO)

## 2021-07-07 ENCOUNTER — Other Ambulatory Visit: Payer: Self-pay

## 2021-07-07 DIAGNOSIS — Z23 Encounter for immunization: Secondary | ICD-10-CM

## 2021-07-12 ENCOUNTER — Ambulatory Visit
Admission: RE | Admit: 2021-07-12 | Discharge: 2021-07-12 | Disposition: A | Discharge status: Home / Self Care | Payer: Managed Care, Other (non HMO) | Source: Ambulatory Visit | Attending: Emergency Medicine | Admitting: Emergency Medicine

## 2021-07-12 ENCOUNTER — Other Ambulatory Visit: Payer: Self-pay

## 2021-07-12 DIAGNOSIS — Z23 Encounter for immunization: Secondary | ICD-10-CM

## 2021-07-12 MED ORDER — RABIES VACCINE, PCEC IM SUSR: 1.0000 mL | Freq: Once | INTRAMUSCULAR | Status: DC

## 2021-07-12 NOTE — ED Triage Notes (Signed)
Pt here for last Rabies vaccination

## 2021-10-21 ENCOUNTER — Other Ambulatory Visit: Payer: Self-pay | Admitting: Orthopedic Surgery

## 2021-10-21 DIAGNOSIS — M7501 Adhesive capsulitis of right shoulder: Secondary | ICD-10-CM

## 2021-10-21 DIAGNOSIS — M7541 Impingement syndrome of right shoulder: Secondary | ICD-10-CM

## 2021-11-10 ENCOUNTER — Ambulatory Visit
Admission: RE | Admit: 2021-11-10 | Discharge: 2021-11-10 | Disposition: A | Discharge status: Home / Self Care | Payer: Managed Care, Other (non HMO) | Source: Ambulatory Visit | Attending: Orthopedic Surgery | Admitting: Orthopedic Surgery

## 2021-11-10 ENCOUNTER — Other Ambulatory Visit: Payer: Self-pay

## 2021-11-10 DIAGNOSIS — M7541 Impingement syndrome of right shoulder: Secondary | ICD-10-CM

## 2021-11-10 DIAGNOSIS — M7501 Adhesive capsulitis of right shoulder: Secondary | ICD-10-CM | POA: Insufficient documentation

## 2021-11-10 MED ORDER — LIDOCAINE HCL (PF) 1 % IJ SOLN
10.0000 mL | Freq: Once | INTRAMUSCULAR | Status: AC
  Administered 2021-11-10: 5 mL
  Filled 2021-11-10: qty 10

## 2021-11-10 MED ORDER — IOHEXOL 180 MG/ML  SOLN
20.0000 mL | Freq: Once | INTRAMUSCULAR | Status: AC | PRN
  Administered 2021-11-10: 15 mL

## 2021-11-10 MED ORDER — SODIUM CHLORIDE (PF) 0.9 % IJ SOLN
10.0000 mL | INTRAMUSCULAR | Status: DC | PRN
  Administered 2021-11-10: 5 mL

## 2021-11-10 MED ORDER — GADOBUTROL 1 MMOL/ML IV SOLN
2.0000 mL | Freq: Once | INTRAVENOUS | Status: AC | PRN
  Administered 2021-11-10: 0.05 mL

## 2021-11-10 NOTE — Procedures (Signed)
Successful fluoroscopic guided right shoulder arthrogram for MRI. No complications. See PACS for full report.  Hedy Jacob, PA-C 11/10/2021, 9:42 AM

## 2021-12-03 ENCOUNTER — Ambulatory Visit (INDEPENDENT_AMBULATORY_CARE_PROVIDER_SITE_OTHER): Payer: Managed Care, Other (non HMO)

## 2021-12-03 ENCOUNTER — Other Ambulatory Visit: Payer: Self-pay

## 2021-12-03 DIAGNOSIS — Z23 Encounter for immunization: Secondary | ICD-10-CM | POA: Diagnosis not present

## 2021-12-07 ENCOUNTER — Ambulatory Visit: Payer: Managed Care, Other (non HMO)

## 2022-03-21 ENCOUNTER — Ambulatory Visit: Payer: Managed Care, Other (non HMO) | Admitting: Nurse Practitioner

## 2022-03-21 ENCOUNTER — Ambulatory Visit: Payer: Self-pay

## 2022-03-21 ENCOUNTER — Encounter: Payer: Self-pay | Admitting: Nurse Practitioner

## 2022-03-21 VITALS — BP 131/89 | HR 87 | Temp 98.8°F | Wt 262.0 lb

## 2022-03-21 DIAGNOSIS — R079 Chest pain, unspecified: Secondary | ICD-10-CM

## 2022-03-21 DIAGNOSIS — R002 Palpitations: Secondary | ICD-10-CM | POA: Diagnosis not present

## 2022-03-21 NOTE — Assessment & Plan Note (Signed)
EKG unremarkable.  Ongoing Palpitations x 1 week that can last for about 30 minutes.  Will refer to Cardiology for Holter monitor and further evaluation.   ?

## 2022-03-21 NOTE — Progress Notes (Signed)
? ?BP 131/89   Pulse 87   Temp 98.8 ?F (37.1 ?C) (Oral)   Wt 262 lb (118.8 kg)   SpO2 97%   BMI 37.59 kg/m?   ? ?Subjective:  ? ? Patient ID: Antonio Mueller, male    DOB: 05-03-1986, 36 y.o.   MRN: [id number] ? ?HPI: ?Antonio Mueller is a 36 y.o. male ? ?Chief Complaint  ?Patient presents with  ? chest discomfort  ?  Center of the chest x 1 week worse with movement. States palpitations intermittent. Feels uneasy. Pt reports no pain , no dizziness, no nausea, no emesis. Denies SOB. Noticed palpitations at rest.   ? ?PALPITATIONS ?Duration:  1 week ?Symptom description: feels uneasy ?Duration of episode: minutes ?Frequency: no history of the same ?Activity when event occurred: rest ?Related to exertion: no ?Dyspnea: no ?Chest pain: no ?Syncope: no ?Anxiety/stress: no ?Nausea/vomiting: no ?Diaphoresis: no ?Coronary artery disease: no ?Congestive heart failure: no ?Arrhythmia:no ?Thyroid disease: no ?Caffeine intake:  one cup of coffee and 1-2 cans of diet coke daily ?Status:  stable ?Treatments attempted:none ? ? ?  03/21/2022  ?  4:05 PM 11/27/2019  ?  2:17 PM  ?GAD 7 : Generalized Anxiety Score  ?Nervous, Anxious, on Edge 0 0  ?Control/stop worrying 0 0  ?Worry too much - different things 0 0  ?Trouble relaxing 0 2  ?Restless 0 0  ?Easily annoyed or irritable 0 1  ?Afraid - awful might happen 0 0  ?Total GAD 7 Score 0 3  ?Anxiety Difficulty Not difficult at all Not difficult at all  ? ? ?[facility] Office Visit from 03/21/2022 in [place]  ?PHQ-9 Total Score 0  ? ?  ? ? ? ?Relevant past medical, surgical, family and social history reviewed and updated as indicated. Interim medical history since our last visit reviewed. ?Allergies and medications reviewed and updated. ? ?Review of Systems  ?Respiratory:  Positive for chest tightness. Negative for shortness of breath.   ?Cardiovascular:  Negative for chest pain.  ?Gastrointestinal:  Negative for nausea and vomiting.  ?Neurological:  Negative for  syncope.  ?Psychiatric/Behavioral:  Negative for dysphoric mood. The patient is not nervous/anxious.   ? ?Per HPI unless specifically indicated above ? ?   ?Objective:  ?  ?BP 131/89   Pulse 87   Temp 98.8 ?F (37.1 ?C) (Oral)   Wt 262 lb (118.8 kg)   SpO2 97%   BMI 37.59 kg/m?   ?Wt Readings from Last 3 Encounters:  ?03/21/22 262 lb (118.8 kg)  ?06/28/21 265 lb (120.2 kg)  ?06/03/21 269 lb 6.4 oz (122.2 kg)  ?  ?Physical Exam ?Vitals and nursing note reviewed.  ?Constitutional:   ?   General: He is not in acute distress. ?   Appearance: Normal appearance. He is obese. He is not ill-appearing, toxic-appearing or diaphoretic.  ?HENT:  ?   Head: Normocephalic.  ?   Right Ear: External ear normal.  ?   Left Ear: External ear normal.  ?   Nose: Nose normal. No congestion or rhinorrhea.  ?   Mouth/Throat:  ?   Mouth: Mucous membranes are moist.  ?Eyes:  ?   General:     ?   Right eye: No discharge.     ?   Left eye: No discharge.  ?   Extraocular Movements: Extraocular movements intact.  ?   Conjunctiva/sclera: Conjunctivae normal.  ?   Pupils: Pupils are equal, round, and reactive to light.  ?Cardiovascular:  ?  Rate and Rhythm: Normal rate and regular rhythm.  ?   Heart sounds: No murmur heard. ?Pulmonary:  ?   Effort: Pulmonary effort is normal. No respiratory distress.  ?   Breath sounds: Normal breath sounds. No wheezing, rhonchi or rales.  ?Abdominal:  ?   General: Abdomen is flat. Bowel sounds are normal.  ?Musculoskeletal:  ?   Cervical back: Normal range of motion and neck supple.  ?Skin: ?   General: Skin is warm and dry.  ?   Capillary Refill: Capillary refill takes less than 2 seconds.  ?Neurological:  ?   General: No focal deficit present.  ?   Mental Status: He is alert and oriented to person, place, and time.  ?Psychiatric:     ?   Mood and Affect: Mood normal.     ?   Behavior: Behavior normal.     ?   Thought Content: Thought content normal.     ?   Judgment: Judgment normal.  ? ? ?Results for  orders placed or performed in visit on 06/03/21  ?Microscopic Examination  ? Urine  ?Result Value Ref Range  ? WBC, UA None seen 0 - 5 /hpf  ? RBC 0-2 0 - 2 /hpf  ? Epithelial Cells (non renal) 0-10 0 - 10 /hpf  ? Bacteria, UA None seen None seen/Few  ?TSH  ?Result Value Ref Range  ? TSH 3.280 0.450 - 4.500 uIU/mL  ?Lipid panel  ?Result Value Ref Range  ? Cholesterol, Total 223 (H) 100 - 199 mg/dL  ? Triglycerides 254 (H) 0 - 149 mg/dL  ? HDL 46 >39 mg/dL  ? VLDL Cholesterol Cal 45 (H) 5 - 40 mg/dL  ? LDL Chol Calc (NIH) 132 (H) 0 - 99 mg/dL  ? Chol/HDL Ratio 4.8 0.0 - 5.0 ratio  ?CBC with Differential/Platelet  ?Result Value Ref Range  ? WBC 7.5 3.4 - 10.8 x10E3/uL  ? RBC 5.52 4.14 - 5.80 x10E6/uL  ? Hemoglobin 16.3 13.0 - 17.7 g/dL  ? Hematocrit 47.8 37.5 - 51.0 %  ? MCV 87 79 - 97 fL  ? MCH 29.5 26.6 - 33.0 pg  ? MCHC 34.1 31.5 - 35.7 g/dL  ? RDW 12.1 11.6 - 15.4 %  ? Platelets 261 150 - 450 x10E3/uL  ? Neutrophils 63 Not Estab. %  ? Lymphs 25 Not Estab. %  ? Monocytes 8 Not Estab. %  ? Eos 3 Not Estab. %  ? Basos 1 Not Estab. %  ? Neutrophils Absolute 4.8 1.4 - 7.0 x10E3/uL  ? Lymphocytes Absolute 1.9 0.7 - 3.1 x10E3/uL  ? Monocytes Absolute 0.6 0.1 - 0.9 x10E3/uL  ? EOS (ABSOLUTE) 0.2 0.0 - 0.4 x10E3/uL  ? Basophils Absolute 0.1 0.0 - 0.2 x10E3/uL  ? Immature Granulocytes 0 Not Estab. %  ? Immature Grans (Abs) 0.0 0.0 - 0.1 x10E3/uL  ?Comprehensive metabolic panel  ?Result Value Ref Range  ? Glucose 109 (H) 65 - 99 mg/dL  ? BUN 7 6 - 20 mg/dL  ? Creatinine, Ser 0.97 0.76 - 1.27 mg/dL  ? eGFR 104 >59 mL/min/1.73  ? BUN/Creatinine Ratio 7 (L) 9 - 20  ? Sodium 139 134 - 144 mmol/L  ? Potassium 4.5 3.5 - 5.2 mmol/L  ? Chloride 101 96 - 106 mmol/L  ? CO2 22 20 - 29 mmol/L  ? Calcium 10.0 8.7 - 10.2 mg/dL  ? Total Protein 7.0 6.0 - 8.5 g/dL  ? Albumin 4.7 4.0 - 5.0 g/dL  ?  Globulin, Total 2.3 1.5 - 4.5 g/dL  ? Albumin/Globulin Ratio 2.0 1.2 - 2.2  ? Bilirubin Total 0.4 0.0 - 1.2 mg/dL  ? Alkaline Phosphatase 68 44 -  121 IU/L  ? AST 29 0 - 40 IU/L  ? ALT 54 (H) 0 - 44 IU/L  ?Urinalysis, Routine w reflex microscopic  ?Result Value Ref Range  ? Specific Gravity, UA 1.015 1.005 - 1.030  ? pH, UA 6.0 5.0 - 7.5  ? Color, UA Yellow Yellow  ? Appearance Ur Clear Clear  ? Leukocytes,UA Negative Negative  ? Protein,UA Negative Negative/Trace  ? Glucose, UA Negative Negative  ? Ketones, UA Negative Negative  ? RBC, UA Trace (A) Negative  ? Bilirubin, UA Negative Negative  ? Urobilinogen, Ur 0.2 0.2 - 1.0 mg/dL  ? Nitrite, UA Negative Negative  ? Microscopic Examination See below:   ?Hepatitis C Antibody  ?Result Value Ref Range  ? Hep C Virus Ab 0.5 0.0 - 0.9 s/co ratio  ?Vitamin D (25 hydroxy)  ?Result Value Ref Range  ? Vit D, 25-Hydroxy 44.6 30.0 - 100.0 ng/mL  ? ?   ?Assessment & Plan:  ? ?Problem List Items Addressed This Visit   ? ?  ? Other  ? Chest pain - Primary  ?  EKG unremarkable.  Ongoing Palpitations x 1 week that can last for about 30 minutes.  Will refer to Cardiology for Holter monitor and further evaluation.   ? ?  ?  ? Relevant Orders  ? EKG 12-Lead (Completed)  ? Ambulatory referral to Cardiology  ? ?Other Visit Diagnoses   ? ? Palpitations      ? Relevant Orders  ? Ambulatory referral to Cardiology  ? ?  ?  ? ?Follow up plan: ?Return if symptoms worsen or fail to improve. ? ? ? ? ? ?

## 2022-03-21 NOTE — Telephone Encounter (Signed)
?  Chief Complaint: palpitations ?Symptoms: chest palpitations and discomfort  ?Frequency: 7-8 days  ?Pertinent Negatives: Patient denies SOB or getting worse with activity ?Disposition: '[]'$ ED /'[]'$ Urgent Care (no appt availability in office) / '[x]'$ Appointment(In office/virtual)/ '[]'$  [facility]/ '[]'$ Home Care/ '[]'$ Refused Recommended Disposition /'[]'$ [facility]/ '[]'$  Follow-up with PCP ?Additional Notes: scheduled pt for appt today at 1600 with Santiago Glad, NP.  ? ? ?Reason for Disposition ? [1] Skipped or extra beat(s) AND [2] occurs 4 or more times per minute ? ?Answer Assessment - Initial Assessment Questions ?1. LOCATION: "Where does it hurt?"   ?    In the center of chest  ?2. RADIATION: "Does the pain go anywhere else?" (e.g., into neck, jaw, arms, back) ?    Yes back ?3. ONSET: "When did the chest pain begin?" (Minutes, hours or days)  ?    7-8 days  ?4. PATTERN "Does the pain come and go, or has it been constant since it started?"  "Does it get worse with exertion?"  ?    Comes and goes  ?6. SEVERITY: "How bad is the pain?"  (e.g., Scale 1-10; mild, moderate, or severe) ?   - MILD (1-3): doesn't interfere with normal activities  ?   - MODERATE (4-7): interferes with normal activities or awakens from sleep ?   - SEVERE (8-10): excruciating pain, unable to do any normal activities   ?    Discomfort 1-2 ?10. OTHER SYMPTOMS: "Do you have any other symptoms?" (e.g., dizziness, nausea, vomiting, sweating, fever, difficulty breathing, cough) ?      Skipping a beat ? ?Protocols used: Chest Pain-A-AH, Heart Rate and Heartbeat Questions-A-AH ? ?

## 2022-03-22 NOTE — Progress Notes (Signed)
Results discussed with patient during visit.

## 2022-04-06 NOTE — Progress Notes (Signed)
? ?BP (!) 134/91   Pulse 83   Temp 98.4 ?F (36.9 ?C) (Oral)   Wt 264 lb 6.4 oz (119.9 kg)   SpO2 98%   BMI 37.94 kg/m?   ? ?Subjective:  ? ? Patient ID: Antonio Mueller, male    DOB: [birth year], 36 y.o.   MRN: [id number] ? ?HPI: ?Antonio Mueller is a 36 y.o. male ? ?Chief Complaint  ?Patient presents with  ? Facial Pain  ?  R sided facial pressure x about 4 years ago. States R sided neck stiffness as well.  ?Patient states R ear redness and warmth few times a day x about an hour at a time - describes as tightness and burning.   ? ? ?R sided facial pressure x about 4 years ago. States R sided neck stiffness as well.  Patient states he has been having this issue for several years.  The worst symptoms right now is right sided neck stiffness.  A couple of times a day his right ear gets "red hot" and has a lot of pressure, discomfort and tenderness.  Describes as tightness and burning. He has seen ENT and Ortho.  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 The symptoms move from the ear, neck, shoulder on the right side.  His shoulder pain and movement has improved but still there some. ? ? ?Relevant past medical, surgical, family and social history reviewed and updated as indicated. Interim medical history since our last visit reviewed. ?Allergies and medications reviewed and updated. ? ?Review of Systems  ?HENT:  Positive for ear pain.   ?Musculoskeletal:  Positive for neck pain.  ?     Shoulder pain  ? ?Per HPI unless specifically indicated above ? ?   ?Objective:  ?  ?BP (!) 134/91   Pulse 83   Temp 98.4 ?F (36.9 ?C) (Oral)   Wt 264 lb 6.4 oz (119.9 kg)   SpO2 98%   BMI 37.94 kg/m?   ?Wt Readings from Last 3 Encounters:  ?04/07/22 264 lb 6.4 oz (119.9 kg)  ?03/21/22 262 lb (118.8 kg)  ?06/28/21 265 lb (120.2 kg)  ?  ?Physical Exam ?Vitals and nursing note reviewed.  ?Constitutional:   ?   General: He is not in acute distress. ?   Appearance: Normal appearance. He is not ill-appearing, toxic-appearing or  diaphoretic.  ?HENT:  ?   Head: Normocephalic.  ?   Right Ear: Tympanic membrane and external ear normal.  ?   Left Ear: Tympanic membrane and external ear normal.  ?   Nose: Nose normal. No congestion or rhinorrhea.  ?   Mouth/Throat:  ?   Mouth: Mucous membranes are moist.  ?Eyes:  ?   General:     ?   Right eye: No discharge.     ?   Left eye: No discharge.  ?   Extraocular Movements: Extraocular movements intact.  ?   Conjunctiva/sclera: Conjunctivae normal.  ?   Pupils: Pupils are equal, round, and reactive to light.  ?Cardiovascular:  ?   Rate and Rhythm: Normal rate and regular rhythm.  ?   Heart sounds: No murmur heard. ?Pulmonary:  ?   Effort: Pulmonary effort is normal. No respiratory distress.  ?   Breath sounds: Normal breath sounds. No wheezing, rhonchi or rales.  ?Abdominal:  ?   General: Abdomen is flat. Bowel sounds are normal.  ?Musculoskeletal:  ?   Cervical back: Neck supple. Edema present. Muscular tenderness present. Decreased range of  motion.  ?Skin: ?   General: Skin is warm and dry.  ?   Capillary Refill: Capillary refill takes less than 2 seconds.  ?Neurological:  ?   General: No focal deficit present.  ?   Mental Status: He is alert and oriented to person, place, and time.  ?Psychiatric:     ?   Mood and Affect: Mood normal.     ?   Behavior: Behavior normal.     ?   Thought Content: Thought content normal.     ?   Judgment: Judgment normal.  ? ? ?Results for orders placed or performed in visit on 06/03/21  ?Microscopic Examination  ? Urine  ?Result Value Ref Range  ? WBC, UA None seen 0 - 5 /hpf  ? RBC 0-2 0 - 2 /hpf  ? Epithelial Cells (non renal) 0-10 0 - 10 /hpf  ? Bacteria, UA None seen None seen/Few  ?TSH  ?Result Value Ref Range  ? TSH 3.280 0.450 - 4.500 uIU/mL  ?Lipid panel  ?Result Value Ref Range  ? Cholesterol, Total 223 (H) 100 - 199 mg/dL  ? Triglycerides 254 (H) 0 - 149 mg/dL  ? HDL 46 >39 mg/dL  ? VLDL Cholesterol Cal 45 (H) 5 - 40 mg/dL  ? LDL Chol Calc (NIH) 132 (H) 0 - 99  mg/dL  ? Chol/HDL Ratio 4.8 0.0 - 5.0 ratio  ?CBC with Differential/Platelet  ?Result Value Ref Range  ? WBC 7.5 3.4 - 10.8 x10E3/uL  ? RBC 5.52 4.14 - 5.80 x10E6/uL  ? Hemoglobin 16.3 13.0 - 17.7 g/dL  ? Hematocrit 47.8 37.5 - 51.0 %  ? MCV 87 79 - 97 fL  ? MCH 29.5 26.6 - 33.0 pg  ? MCHC 34.1 31.5 - 35.7 g/dL  ? RDW 12.1 11.6 - 15.4 %  ? Platelets 261 150 - 450 x10E3/uL  ? Neutrophils 63 Not Estab. %  ? Lymphs 25 Not Estab. %  ? Monocytes 8 Not Estab. %  ? Eos 3 Not Estab. %  ? Basos 1 Not Estab. %  ? Neutrophils Absolute 4.8 1.4 - 7.0 x10E3/uL  ? Lymphocytes Absolute 1.9 0.7 - 3.1 x10E3/uL  ? Monocytes Absolute 0.6 0.1 - 0.9 x10E3/uL  ? EOS (ABSOLUTE) 0.2 0.0 - 0.4 x10E3/uL  ? Basophils Absolute 0.1 0.0 - 0.2 x10E3/uL  ? Immature Granulocytes 0 Not Estab. %  ? Immature Grans (Abs) 0.0 0.0 - 0.1 x10E3/uL  ?Comprehensive metabolic panel  ?Result Value Ref Range  ? Glucose 109 (H) 65 - 99 mg/dL  ? BUN 7 6 - 20 mg/dL  ? Creatinine, Ser 0.97 0.76 - 1.27 mg/dL  ? eGFR 104 >59 mL/min/1.73  ? BUN/Creatinine Ratio 7 (L) 9 - 20  ? Sodium 139 134 - 144 mmol/L  ? Potassium 4.5 3.5 - 5.2 mmol/L  ? Chloride 101 96 - 106 mmol/L  ? CO2 22 20 - 29 mmol/L  ? Calcium 10.0 8.7 - 10.2 mg/dL  ? Total Protein 7.0 6.0 - 8.5 g/dL  ? Albumin 4.7 4.0 - 5.0 g/dL  ? Globulin, Total 2.3 1.5 - 4.5 g/dL  ? Albumin/Globulin Ratio 2.0 1.2 - 2.2  ? Bilirubin Total 0.4 0.0 - 1.2 mg/dL  ? Alkaline Phosphatase 68 44 - 121 IU/L  ? AST 29 0 - 40 IU/L  ? ALT 54 (H) 0 - 44 IU/L  ?Urinalysis, Routine w reflex microscopic  ?Result Value Ref Range  ? Specific Gravity, UA 1.015 1.005 - 1.030  ?  pH, UA 6.0 5.0 - 7.5  ? Color, UA Yellow Yellow  ? Appearance Ur Clear Clear  ? Leukocytes,UA Negative Negative  ? Protein,UA Negative Negative/Trace  ? Glucose, UA Negative Negative  ? Ketones, UA Negative Negative  ? RBC, UA Trace (A) Negative  ? Bilirubin, UA Negative Negative  ? Urobilinogen, Ur 0.2 0.2 - 1.0 mg/dL  ? Nitrite, UA Negative Negative  ? Microscopic  Examination See below:   ?Hepatitis C Antibody  ?Result Value Ref Range  ? Hep C Virus Ab 0.5 0.0 - 0.9 s/co ratio  ?Vitamin D (25 hydroxy)  ?Result Value Ref Range  ? Vit D, 25-Hydroxy 44.6 30.0 - 100.0 ng/mL  ? ?   ?Assessment & Plan:  ? ?Problem List Items Addressed This Visit   ? ?  ? Other  ? Right ear pain - Primary  ?  Ongoing x 4 years on and off.  Patient has had extensive imaging on neck, spine and head.  Imaging reviewed and no abnormality found to explain symptoms.  Patient has seen ENT who did not have anything to offer.  Patient has seen a dentist to rule out jaw concern.  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  Will place referral to Dr. Sharlet Salina for physical medicine. ? ?  ?  ? ?Other Visit Diagnoses   ? ? Neck stiffness      ? Relevant Orders  ? Ambulatory referral to Physical Medicine Rehab  ? ?  ?  ? ?Follow up plan: ?Return if symptoms worsen or fail to improve. ? ?A total of 30 minutes were spent on this encounter today.  When total time is documented, this includes both the face-to-face and non-face-to-face time personally spent before, during and after the visit on the date of the encounter reviewing imaging and previous office visits, current symptoms, plan of care moving forward.  ? ? ? ? ?

## 2022-04-07 ENCOUNTER — Encounter: Payer: Self-pay | Admitting: Nurse Practitioner

## 2022-04-07 ENCOUNTER — Ambulatory Visit: Payer: Managed Care, Other (non HMO) | Admitting: Nurse Practitioner

## 2022-04-07 VITALS — BP 134/91 | HR 83 | Temp 98.4°F | Wt 264.4 lb

## 2022-04-07 DIAGNOSIS — H9201 Otalgia, right ear: Secondary | ICD-10-CM

## 2022-04-07 DIAGNOSIS — M436 Torticollis: Secondary | ICD-10-CM | POA: Diagnosis not present

## 2022-04-07 NOTE — Assessment & Plan Note (Addendum)
Ongoing x 4 years on and off.  Patient has had extensive imaging on neck, spine and head.  Imaging reviewed and no abnormality found to explain symptoms.  Patient has seen ENT who did not have anything to offer.  Patient has seen a dentist to rule out jaw concern.  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  Will place referral to Dr. Sharlet Salina for physical medicine. ?

## 2022-04-11 ENCOUNTER — Ambulatory Visit: Payer: Managed Care, Other (non HMO) | Admitting: Nurse Practitioner

## 2022-04-21 ENCOUNTER — Encounter: Payer: Self-pay | Admitting: Internal Medicine

## 2022-04-21 ENCOUNTER — Ambulatory Visit: Payer: Managed Care, Other (non HMO) | Admitting: Internal Medicine

## 2022-04-21 VITALS — BP 124/90 | HR 72 | Ht 69.0 in | Wt 261.0 lb

## 2022-04-21 DIAGNOSIS — R002 Palpitations: Secondary | ICD-10-CM | POA: Diagnosis not present

## 2022-04-21 DIAGNOSIS — R079 Chest pain, unspecified: Secondary | ICD-10-CM | POA: Diagnosis not present

## 2022-04-21 DIAGNOSIS — E782 Mixed hyperlipidemia: Secondary | ICD-10-CM | POA: Diagnosis not present

## 2022-04-21 NOTE — Progress Notes (Signed)
New Outpatient Visit Date: 04/21/2022  Referring Provider: Jon Billings, NP [address] [place],  [place] [address]  Chief Complaint: Pain and palpitations  HPI:  Mr. Heber is a 36 y.o. male who is being seen today for the evaluation of chest pain and palpitations at the request of Ms. Holdsworth. He has a history of hyperlipidemia, Lyme disease, and migraine headaches.  He saw Ms. Holdsworth on 03/21/2022, at which time he complained of a 1 week history of chest discomfort and palpitations.  EKG was unremarkable.  About 1.5 months ago, Mr. Coykendall first noticed an ache in his chest.  He states it was in the center and just left of center with radiation to his back.  It was most pronounced at rest.  He has never had exertional chest pain.  He sometimes felt a skipped heartbeat while having the pain, which made him particularly concerned.  He used NSAIDs with some relief of the chest pain.  The palpitations have now ceased though he still notes a vague soreness in his chest.  Mr. Dugar notes that he had similar symptoms about 3 years ago, prompting him to see Dr. Ubaldo Glassing at [facility].  A definitive cause of his symptoms was never identified though he was given a presumptive diagnosis of pericarditis.  He was treated with colchicine for 3 months and had not had any further chest pain.  Work-up at that time included an echocardiogram and event monitor.  Mr. Samples denies shortness of breath, lightheadedness, orthopnea, and edema.  --------------------------------------------------------------------------------------------------  Cardiovascular History & Procedures: Cardiovascular Problems: Palpitations and chest discomfort  Risk Factors: Hyperlipidemia, male gender, and obesity  Cath/PCI: None  CV Surgery: None  EP Procedures and Devices: Event monitor (07/2019, [facility]): Details not available but reportedly this did not show any significant arrhythmias.  Non-Invasive  Evaluation(s): TTE (08/06/2019, [place] [facility]): Normal LV size.  LVEF >55%.  Normal RV size and function.  Trivial tricuspid and aortic regurgitation.  Recent CV Pertinent Labs: Lab Results  Component Value Date   CHOL 223 (H) 06/03/2021   HDL 46 06/03/2021   LDLCALC 132 (H) 06/03/2021   TRIG 254 (H) 06/03/2021   CHOLHDL 4.8 06/03/2021   K 4.5 06/03/2021   BUN 7 06/03/2021   CREATININE 0.97 06/03/2021    --------------------------------------------------------------------------------------------------  Past Medical History:  Diagnosis Date   Colon polyp    Hyperlipidemia    Lichen striatus    Lyme disease    Migraine headache     Past Surgical History:  Procedure Laterality Date   COLONOSCOPY  03/05/2018   Procedure: COLONOSCOPY;  Surgeon: Lin Landsman, MD;  Location: [facility] ENDOSCOPY;  Service: Gastroenterology;;   COLONOSCOPY W/ POLYPECTOMY     FLEXIBLE SIGMOIDOSCOPY     HERNIA REPAIR     1 as a child    Current Meds  Medication Sig   Alpha-Lipoic Acid 300 MG CAPS Take 1,200 mg by mouth daily.   bifidobacterium infantis (ALIGN) capsule Take 1 capsule by mouth daily.   FIBER PO Take 5 capsules by mouth daily.   Multiple Vitamins-Minerals (MULTIVITAMIN WITH MINERALS) tablet Take 1 tablet by mouth daily.   naproxen sodium (ALEVE) 220 MG tablet Take 220 mg by mouth daily as needed.   Probiotic Product (DIGESTIVE ADVANTAGE PO) Take by mouth.   VITAMIN D PO Take by mouth daily.    Allergies: Propofol and Nickel  Social History   Tobacco Use   Smoking status: Never   Smokeless tobacco: Never  Vaping Use   Vaping Use: Never used  Substance Use Topics   Alcohol use: Not Currently    Comment: 1 drink per year during holidays   Drug use: No    Family History  Problem Relation Age of Onset   Thyroid disease Mother    Cancer Mother        Basal Cell Carinoma   Heart murmur Mother    Hepatitis C Father    Arthritis Father        Rheumatoid   Cancer  Maternal Grandmother        Breast   Cancer Maternal Grandfather        bladder   Heart disease Maternal Grandfather    Alzheimer's disease Maternal Grandfather    Alzheimer's disease Paternal Grandmother    Heart disease Paternal Grandfather    Congestive Heart Failure Paternal Grandfather    Parkinson's disease Paternal Grandfather    Heart attack Cousin    Crohn's disease Cousin    Cancer Other        Colon Cancer   Cancer Other        Colon    Review of Systems: A 12-system review of systems was performed and was negative except as noted in the HPI.  --------------------------------------------------------------------------------------------------  Physical Exam: BP 124/90 (BP Location: Right Arm, Patient Position: Sitting, Cuff Size: Large)   Pulse 72   Ht '5\' 9"'$  (1.753 m)   Wt 261 lb (118.4 kg)   SpO2 98%   BMI 38.54 kg/m   General: NAD. HEENT: No conjunctival pallor or scleral icterus. Facemask in place. Neck: Supple without lymphadenopathy, thyromegaly, JVD, or HJR. No carotid bruit. Lungs: Normal work of breathing. Clear to auscultation bilaterally without wheezes or crackles. Heart: Regular rate and rhythm without murmurs, rubs, or gallops. Non-displaced PMI. Abd: Bowel sounds present. Soft, NT/ND without hepatosplenomegaly Ext: No lower extremity edema. Radial, PT, and DP pulses are 2+ bilaterally Skin: Warm and dry without rash. Neuro: CNIII-XII intact. Strength and fine-touch sensation intact in upper and lower extremities bilaterally. Psych: Normal mood and affect.  EKG: Normal sinus rhythm without abnormality.  Lab Results  Component Value Date   WBC 7.5 06/03/2021   HGB 16.3 06/03/2021   HCT 47.8 06/03/2021   MCV 87 06/03/2021   PLT 261 06/03/2021    Lab Results  Component Value Date   NA 139 06/03/2021   K 4.5 06/03/2021   CL 101 06/03/2021   CO2 22 06/03/2021   BUN 7 06/03/2021   CREATININE 0.97 06/03/2021   GLUCOSE 109 (H) 06/03/2021    ALT 54 (H) 06/03/2021    Lab Results  Component Value Date   CHOL 223 (H) 06/03/2021   HDL 46 06/03/2021   LDLCALC 132 (H) 06/03/2021   TRIG 254 (H) 06/03/2021   CHOLHDL 4.8 06/03/2021     --------------------------------------------------------------------------------------------------  ASSESSMENT AND PLAN: Palpitations: Etiology of palpitations is uncertain, though skipped beat sensation would be most consistent with PACs or PVCs.  EKG today is normal.  Palpitations have been absent for about 2 weeks.  I recommend obtaining a CBC, CMP, magnesium level, and TSH.  We discussed the role for additional cardiac event monitoring but have agreed to defer this given absence of symptoms for at least the last 2 weeks.  I encouraged Mr. Saulter to consider using an Apple Watch or Cardia Mobile device to obtain a rhythm strip if he were to have further symptoms.  Chest pain: Symptoms were atypical and have since  resolved.  I am most suspicious for a musculoskeletal etiology.  Pericarditis is possible given prior suspicion for this, though quality of pain was not classic for this either.  We will defer further work-up at this time other than aforementioned labs.  I will repeat a lipid panel for screening as well.  Hyperlipidemia: Lipid panel in 05/2021 notable for modest elevations of LDL and triglycerides.  I encouraged weight loss through diet and exercise to help improve lipid profile.  We will repeat a lipid panel to reassess his lipids.  Follow-up: Return to clinic in 6 months.  Nelva Bush, MD 04/22/2022 7:51 PM

## 2022-04-21 NOTE — Patient Instructions (Signed)
Medication Instructions:   Your physician recommends that you continue on your current medications as directed. Please refer to the Current Medication list given to you today.  *If you need a refill on your cardiac medications before your next appointment, please call your pharmacy*   Lab Work:  CBC, CMET, Magnesium, TSH, Lipid panel - Orders printed to be done at [facility]  Testing/Procedures:  None ordered   Follow-Up: At [facility] you and your health needs are our priority.  As part of our continuing mission to provide you with exceptional heart care, we have created designated Provider Care Teams.  These Care Teams include your primary Cardiologist (physician) and Advanced Practice Providers (APPs -  Physician Assistants and Nurse Practitioners) who all work together to provide you with the care you need, when you need it.  We recommend signing up for the patient portal called "MyChart".  Sign up information is provided on this After Visit Summary.  MyChart is used to connect with patients for Virtual Visits (Telemedicine).  Patients are able to view lab/test results, encounter notes, upcoming appointments, etc.  Non-urgent messages can be sent to your provider as well.   To learn more about what you can do with MyChart, go to [email or web address].    Your next appointment:   6 month(s)  The format for your next appointment:   In Person  Provider:   You may see Dr. Harrell Gave End or one of the following Advanced Practice Providers on your designated Care Team:   Murray Hodgkins, NP Christell Faith, PA-C Cadence Kathlen Mody, PA-C{   Important Information About Sugar

## 2022-04-22 ENCOUNTER — Encounter: Payer: Self-pay | Admitting: Internal Medicine

## 2022-04-22 ENCOUNTER — Other Ambulatory Visit: Payer: Self-pay | Admitting: Internal Medicine

## 2022-04-22 DIAGNOSIS — E782 Mixed hyperlipidemia: Secondary | ICD-10-CM | POA: Insufficient documentation

## 2022-04-22 DIAGNOSIS — R002 Palpitations: Secondary | ICD-10-CM | POA: Insufficient documentation

## 2022-04-23 LAB — COMPREHENSIVE METABOLIC PANEL
ALT: 30 IU/L (ref 0–44)
AST: 20 IU/L (ref 0–40)
Albumin/Globulin Ratio: 2 (ref 1.2–2.2)
Albumin: 4.7 g/dL (ref 4.0–5.0)
Alkaline Phosphatase: 59 IU/L (ref 44–121)
BUN/Creatinine Ratio: 10 (ref 9–20)
BUN: 9 mg/dL (ref 6–20)
Bilirubin Total: 0.7 mg/dL (ref 0.0–1.2)
CO2: 22 mmol/L (ref 20–29)
Calcium: 9.9 mg/dL (ref 8.7–10.2)
Chloride: 101 mmol/L (ref 96–106)
Creatinine, Ser: 0.91 mg/dL (ref 0.76–1.27)
Globulin, Total: 2.3 g/dL (ref 1.5–4.5)
Glucose: 111 mg/dL — ABNORMAL HIGH (ref 70–99)
Potassium: 4.2 mmol/L (ref 3.5–5.2)
Sodium: 139 mmol/L (ref 134–144)
Total Protein: 7 g/dL (ref 6.0–8.5)
eGFR: 112 mL/min/{1.73_m2} (ref >59)

## 2022-04-23 LAB — LIPID PANEL
Chol/HDL Ratio: 5 ratio (ref 0.0–5.0)
Cholesterol, Total: 228 mg/dL — ABNORMAL HIGH (ref 100–199)
HDL: 46 mg/dL (ref >39)
LDL Chol Calc (NIH): 153 mg/dL — ABNORMAL HIGH (ref 0–99)
Triglycerides: 160 mg/dL — ABNORMAL HIGH (ref 0–149)
VLDL Cholesterol Cal: 29 mg/dL (ref 5–40)

## 2022-04-23 LAB — CBC
Hematocrit: 46.9 % (ref 37.5–51.0)
Hemoglobin: 16.4 g/dL (ref 13.0–17.7)
MCH: 29.9 pg (ref 26.6–33.0)
MCHC: 35 g/dL (ref 31.5–35.7)
MCV: 86 fL (ref 79–97)
Platelets: 283 10*3/uL (ref 150–450)
RBC: 5.48 x10E6/uL (ref 4.14–5.80)
RDW: 12.3 % (ref 11.6–15.4)
WBC: 8.3 10*3/uL (ref 3.4–10.8)

## 2022-04-23 LAB — MAGNESIUM: Magnesium: 2 mg/dL (ref 1.6–2.3)

## 2022-04-23 LAB — TSH: TSH: 1.79 u[IU]/mL (ref 0.450–4.500)

## 2022-06-24 ENCOUNTER — Ambulatory Visit (INDEPENDENT_AMBULATORY_CARE_PROVIDER_SITE_OTHER): Payer: Managed Care, Other (non HMO) | Admitting: Nurse Practitioner

## 2022-06-24 ENCOUNTER — Encounter: Payer: Self-pay | Admitting: Nurse Practitioner

## 2022-06-24 VITALS — BP 134/89 | HR 76 | Temp 97.8°F | Wt 255.9 lb

## 2022-06-24 DIAGNOSIS — D229 Melanocytic nevi, unspecified: Secondary | ICD-10-CM

## 2022-06-24 DIAGNOSIS — Z0000 Encounter for general adult medical examination without abnormal findings: Secondary | ICD-10-CM | POA: Diagnosis not present

## 2022-06-24 DIAGNOSIS — E782 Mixed hyperlipidemia: Secondary | ICD-10-CM

## 2022-06-24 DIAGNOSIS — E559 Vitamin D deficiency, unspecified: Secondary | ICD-10-CM

## 2022-06-24 LAB — URINALYSIS, ROUTINE W REFLEX MICROSCOPIC
Bilirubin, UA: NEGATIVE
Glucose, UA: NEGATIVE
Ketones, UA: NEGATIVE
Leukocytes,UA: NEGATIVE
Nitrite, UA: NEGATIVE
Protein,UA: NEGATIVE
Specific Gravity, UA: 1.015 (ref 1.005–1.030)
Urobilinogen, Ur: 0.2 mg/dL (ref 0.2–1.0)
pH, UA: 6 (ref 5.0–7.5)

## 2022-06-24 LAB — MICROSCOPIC EXAMINATION
Bacteria, UA: NONE SEEN
Epithelial Cells (non renal): NONE SEEN /hpf (ref 0–10)
WBC, UA: NONE SEEN /hpf (ref 0–5)

## 2022-06-24 NOTE — Assessment & Plan Note (Signed)
Labs ordered today. Will make recommendations based on lab results.  Continue with diet and exercise.

## 2022-06-24 NOTE — Assessment & Plan Note (Signed)
Labs ordered today.  Will make recommendations based on lab results. ?

## 2022-06-24 NOTE — Progress Notes (Signed)
BP 134/89   Pulse 76   Temp 97.8 F (36.6 C) (Oral)   Wt 255 lb 14.4 oz (116.1 kg)   SpO2 98%   BMI 37.79 kg/m    Subjective:    Patient ID: Antonio Mueller, male    DOB: 1986-01-04, 36 y.o.   MRN: [id number]  HPI: Antonio Mueller is a 36 y.o. male presenting on 06/24/2022 for comprehensive medical examination. Current medical complaints include: mole  He currently lives with: Interim Problems from his last visit: no   Denies HA, CP, SOB, dizziness, palpitations, visual changes, and lower extremity swelling.  Depression Screen done today and results listed below:     04/07/2022    8:38 AM 03/21/2022    4:04 PM 06/03/2021    8:19 AM 03/03/2021    1:06 PM 11/27/2019    2:16 PM  Depression screen PHQ 2/9  Decreased Interest 0 0 0 0 0  Down, Depressed, Hopeless 0 0 0 0 0  PHQ - 2 Score 0 0 0 0 0  Altered sleeping 0 0   0  Tired, decreased energy 1 0   1  Change in appetite 0 0   0  Feeling bad or failure about yourself  0 0   0  Trouble concentrating 0 0   0  Moving slowly or fidgety/restless 0 0   0  Suicidal thoughts 0 0   0  PHQ-9 Score 1 0   1  Difficult doing work/chores Not difficult at all Not difficult at all       The patient does not have a history of falls. I did complete a risk assessment for falls. A plan of care for falls was documented.   Past Medical History:  Past Medical History:  Diagnosis Date   Colon polyp    Hyperlipidemia    Lichen striatus    Lyme disease    Migraine headache     Surgical History:  Past Surgical History:  Procedure Laterality Date   COLONOSCOPY  03/05/2018   Procedure: COLONOSCOPY;  Surgeon: Lin Landsman, MD;  Location: [facility] ENDOSCOPY;  Service: Gastroenterology;;   COLONOSCOPY W/ POLYPECTOMY     [place]     2 as a child    Medications:  Current Outpatient Medications on File Prior to Visit  Medication Sig   Alpha-Lipoic Acid 300 MG CAPS Take 1,200 mg by mouth daily.    bifidobacterium infantis (ALIGN) capsule Take 1 capsule by mouth daily.   FIBER PO Take 5 capsules by mouth daily.   Multiple Vitamins-Minerals (MULTIVITAMIN WITH MINERALS) tablet Take 1 tablet by mouth daily.   naproxen sodium (ALEVE) 220 MG tablet Take 220 mg by mouth daily as needed.   Probiotic Product (DIGESTIVE ADVANTAGE PO) Take by mouth.   VITAMIN D PO Take by mouth daily.   No current facility-administered medications on file prior to visit.    Allergies:  Allergies  Allergen Reactions   Propofol     Other reaction(s): Other (See Comments) Memory problems   Nickel Rash    Social History:  Social History   Socioeconomic History   Marital status: Married    Spouse name: Not on file   Number of children: Not on file   Years of education: Not on file   Highest education level: Not on file  Occupational History   Not on file  Tobacco Use   Smoking status: Never   Smokeless tobacco: Never  Vaping Use   Vaping Use: Never used  Substance and Sexual Activity   Alcohol use: Not Currently    Comment: 1 drink per year during holidays   Drug use: No   Sexual activity: Yes    Birth control/protection: Condom  Other Topics Concern   Not on file  Social History Narrative   Not on file   Social Determinants of Health   Financial Resource Strain: Not on file  Food Insecurity: Not on file  Transportation Needs: Not on file  Physical Activity: Not on file  Stress: Not on file  Social Connections: Not on file  Intimate Partner Violence: Not on file   Social History   Tobacco Use  Smoking Status Never  Smokeless Tobacco Never   Social History   Substance and Sexual Activity  Alcohol Use Not Currently   Comment: 1 drink per year during holidays    Family History:  Family History  Problem Relation Age of Onset   Thyroid disease Mother    Cancer Mother        Basal Cell Carinoma   Heart murmur Mother    Hepatitis C Father    Arthritis Father         Rheumatoid   Cancer Maternal Grandmother        Breast   Cancer Maternal Grandfather        bladder   Heart disease Maternal Grandfather    Alzheimer's disease Maternal Grandfather    Alzheimer's disease Paternal Grandmother    Heart disease Paternal Grandfather    Congestive Heart Failure Paternal Grandfather    Parkinson's disease Paternal Grandfather    Heart attack Cousin    Crohn's disease Cousin    Cancer Other        Colon Cancer   Cancer Other        Colon    Past medical history, surgical history, medications, allergies, family history and social history reviewed with patient today and changes made to appropriate areas of the chart.   Review of Systems  Eyes:  Negative for blurred vision and double vision.  Respiratory:  Negative for shortness of breath.   Cardiovascular:  Negative for chest pain, palpitations and leg swelling.  Skin:        moles  Neurological:  Negative for dizziness and headaches.   All other ROS negative except what is listed above and in the HPI.      Objective:    BP 134/89   Pulse 76   Temp 97.8 F (36.6 C) (Oral)   Wt 255 lb 14.4 oz (116.1 kg)   SpO2 98%   BMI 37.79 kg/m   Wt Readings from Last 3 Encounters:  06/24/22 255 lb 14.4 oz (116.1 kg)  04/21/22 261 lb (118.4 kg)  04/07/22 264 lb 6.4 oz (119.9 kg)    Physical Exam Vitals and nursing note reviewed.  Constitutional:      General: He is not in acute distress.    Appearance: Normal appearance. He is obese. He is not ill-appearing, toxic-appearing or diaphoretic.  HENT:     Head: Normocephalic.     Right Ear: Tympanic membrane, ear canal and external ear normal.     Left Ear: Tympanic membrane, ear canal and external ear normal.     Nose: Nose normal. No congestion or rhinorrhea.     Mouth/Throat:     Mouth: Mucous membranes are moist.  Eyes:     General:  Right eye: No discharge.        Left eye: No discharge.     Extraocular Movements: Extraocular movements  intact.     Conjunctiva/sclera: Conjunctivae normal.     Pupils: Pupils are equal, round, and reactive to light.  Cardiovascular:     Rate and Rhythm: Normal rate and regular rhythm.     Heart sounds: No murmur heard. Pulmonary:     Effort: Pulmonary effort is normal. No respiratory distress.     Breath sounds: Normal breath sounds. No wheezing, rhonchi or rales.  Abdominal:     General: Abdomen is flat. Bowel sounds are normal. There is no distension.     Palpations: Abdomen is soft.     Tenderness: There is no abdominal tenderness. There is no guarding.  Musculoskeletal:     Cervical back: Normal range of motion and neck supple.  Skin:    General: Skin is warm and dry.     Capillary Refill: Capillary refill takes less than 2 seconds.  Neurological:     General: No focal deficit present.     Mental Status: He is alert and oriented to person, place, and time.     Cranial Nerves: No cranial nerve deficit.     Motor: No weakness.     Deep Tendon Reflexes: Reflexes normal.  Psychiatric:        Mood and Affect: Mood normal.        Behavior: Behavior normal.        Thought Content: Thought content normal.        Judgment: Judgment normal.     Results for orders placed or performed in visit on 04/22/22  Comprehensive metabolic panel  Result Value Ref Range   Glucose 111 (H) 70 - 99 mg/dL   BUN 9 6 - 20 mg/dL   Creatinine, Ser 0.91 0.76 - 1.27 mg/dL   eGFR 112 >59 mL/min/1.73   BUN/Creatinine Ratio 10 9 - 20   Sodium 139 134 - 144 mmol/L   Potassium 4.2 3.5 - 5.2 mmol/L   Chloride 101 96 - 106 mmol/L   CO2 22 20 - 29 mmol/L   Calcium 9.9 8.7 - 10.2 mg/dL   Total Protein 7.0 6.0 - 8.5 g/dL   Albumin 4.7 4.0 - 5.0 g/dL   Globulin, Total 2.3 1.5 - 4.5 g/dL   Albumin/Globulin Ratio 2.0 1.2 - 2.2   Bilirubin Total 0.7 0.0 - 1.2 mg/dL   Alkaline Phosphatase 59 44 - 121 IU/L   AST 20 0 - 40 IU/L   ALT 30 0 - 44 IU/L  CBC  Result Value Ref Range   WBC 8.3 3.4 - 10.8 x10E3/uL    RBC 5.48 4.14 - 5.80 x10E6/uL   Hemoglobin 16.4 13.0 - 17.7 g/dL   Hematocrit 46.9 37.5 - 51.0 %   MCV 86 79 - 97 fL   MCH 29.9 26.6 - 33.0 pg   MCHC 35.0 31.5 - 35.7 g/dL   RDW 12.3 11.6 - 15.4 %   Platelets 283 150 - 450 x10E3/uL  Lipid panel  Result Value Ref Range   Cholesterol, Total 228 (H) 100 - 199 mg/dL   Triglycerides 160 (H) 0 - 149 mg/dL   HDL 46 >39 mg/dL   VLDL Cholesterol Cal 29 5 - 40 mg/dL   LDL Chol Calc (NIH) 153 (H) 0 - 99 mg/dL   Chol/HDL Ratio 5.0 0.0 - 5.0 ratio  TSH  Result Value Ref Range   TSH 1.790 0.450 -  4.500 uIU/mL  Magnesium  Result Value Ref Range   Magnesium 2.0 1.6 - 2.3 mg/dL      Assessment & Plan:   Problem List Items Addressed This Visit       Other   Vitamin D deficiency    Labs ordered today. Will make recommendations based on lab results.       Relevant Orders   Vitamin D (25 hydroxy)   Mixed hyperlipidemia    Labs ordered today. Will make recommendations based on lab results.  Continue with diet and exercise.       Relevant Orders   Lipid panel   Other Visit Diagnoses     Annual physical exam    -  Primary   Health maintenance reviewed during visit today. Labs ordered.  Vaccines up to date.   Relevant Orders   Vitamin D (25 hydroxy)   TSH   Lipid panel   CBC with Differential/Platelet   Comprehensive metabolic panel   Urinalysis, Routine w reflex microscopic   Atypical mole       Patient would like to have evaluation with Dermatology.   Relevant Orders   Ambulatory referral to Dermatology        Discussed aspirin prophylaxis for myocardial infarction prevention and decision was it was not indicated  LABORATORY TESTING:  Health maintenance labs ordered today as discussed above.    IMMUNIZATIONS:   - Tdap: Tetanus vaccination status reviewed: last tetanus booster within 10 years. - Influenza: Postponed to flu season - Pneumovax: Not applicable - Prevnar: Not applicable - COVID: Not applicable - HPV:  Up to date - Shingrix vaccine: Not applicable  SCREENING: - Colonoscopy: Not applicable  Discussed with patient purpose of the colonoscopy is to detect colon cancer at curable precancerous or early stages   - AAA Screening: Not applicable  -Hearing Test: Not applicable  -Spirometry: Not applicable   PATIENT COUNSELING:    Sexuality: Discussed sexually transmitted diseases, partner selection, use of condoms, avoidance of unintended pregnancy  and contraceptive alternatives.   Advised to avoid cigarette smoking.  I discussed with the patient that most people either abstain from alcohol or drink within safe limits (<=14/week and <=4 drinks/occasion for males, <=7/weeks and <= 3 drinks/occasion for females) and that the risk for alcohol disorders and other health effects rises proportionally with the number of drinks per week and how often a drinker exceeds daily limits.  Discussed cessation/primary prevention of drug use and availability of treatment for abuse.   Diet: Encouraged to adjust caloric intake to maintain  or achieve ideal body weight, to reduce intake of dietary saturated fat and total fat, to limit sodium intake by avoiding high sodium foods and not adding table salt, and to maintain adequate dietary potassium and calcium preferably from fresh fruits, vegetables, and low-fat dairy products.    stressed the importance of regular exercise  Injury prevention: Discussed safety belts, safety helmets, smoke detector, smoking near bedding or upholstery.   Dental health: Discussed importance of regular tooth brushing, flossing, and dental visits.   Follow up plan: NEXT PREVENTATIVE PHYSICAL DUE IN 1 YEAR. Return in about 6 months (around 12/25/2022) for HTN, HLD, DM2 FU.

## 2022-06-24 NOTE — Progress Notes (Deleted)
There were no vitals taken for this visit.   Subjective:    Patient ID: Antonio Mueller, male    DOB: Dec 04, 1986, 36 y.o.   MRN: [id number]  HPI: Antonio Mueller is a 36 y.o. male  No chief complaint on file.  HYPERLIPIDEMIA Hyperlipidemia status: {Blank single:19197::"excellent compliance","good compliance","fair compliance","poor compliance"} Satisfied with current treatment?  {Blank single:19197::"yes","no"} Side effects:  {Blank single:19197::"yes","no"} Medication compliance: {Blank single:19197::"excellent compliance","good compliance","fair compliance","poor compliance"} Past cholesterol meds: {Blank multiple:19196::"none","atorvastain (lipitor)","lovastatin (mevacor)","pravastatin (pravachol)","rosuvastatin (crestor)","simvastatin (zocor)","vytorin","fenofibrate (tricor)","gemfibrozil","ezetimide (zetia)","niaspan","lovaza"} Supplements: {Blank multiple:19196::"none","fish oil","niacin","red yeast rice"} Aspirin:  {Blank single:19197::"yes","no"} The ASCVD Risk score (Arnett DK, et al., 2019) failed to calculate for the following reasons:   The 2019 ASCVD risk score is only valid for ages 38 to 38 Chest pain:  {Blank single:19197::"yes","no"} Coronary artery disease:  {Blank single:19197::"yes","no"} Family history CAD:  {Blank single:19197::"yes","no"} Family history early CAD:  {Blank single:19197::"yes","no"}  Relevant past medical, surgical, family and social history reviewed and updated as indicated. Interim medical history since our last visit reviewed. Allergies and medications reviewed and updated.  Review of Systems  Per HPI unless specifically indicated above     Objective:    There were no vitals taken for this visit.  Wt Readings from Last 3 Encounters:  04/21/22 261 lb (118.4 kg)  04/07/22 264 lb 6.4 oz (119.9 kg)  03/21/22 262 lb (118.8 kg)    Physical Exam  Results for orders placed or performed in visit on 04/22/22  Comprehensive metabolic panel   Result Value Ref Range   Glucose 111 (H) 70 - 99 mg/dL   BUN 9 6 - 20 mg/dL   Creatinine, Ser 0.91 0.76 - 1.27 mg/dL   eGFR 112 >59 mL/min/1.73   BUN/Creatinine Ratio 10 9 - 20   Sodium 139 134 - 144 mmol/L   Potassium 4.2 3.5 - 5.2 mmol/L   Chloride 101 96 - 106 mmol/L   CO2 22 20 - 29 mmol/L   Calcium 9.9 8.7 - 10.2 mg/dL   Total Protein 7.0 6.0 - 8.5 g/dL   Albumin 4.7 4.0 - 5.0 g/dL   Globulin, Total 2.3 1.5 - 4.5 g/dL   Albumin/Globulin Ratio 2.0 1.2 - 2.2   Bilirubin Total 0.7 0.0 - 1.2 mg/dL   Alkaline Phosphatase 59 44 - 121 IU/L   AST 20 0 - 40 IU/L   ALT 30 0 - 44 IU/L  CBC  Result Value Ref Range   WBC 8.3 3.4 - 10.8 x10E3/uL   RBC 5.48 4.14 - 5.80 x10E6/uL   Hemoglobin 16.4 13.0 - 17.7 g/dL   Hematocrit 46.9 37.5 - 51.0 %   MCV 86 79 - 97 fL   MCH 29.9 26.6 - 33.0 pg   MCHC 35.0 31.5 - 35.7 g/dL   RDW 12.3 11.6 - 15.4 %   Platelets 283 150 - 450 x10E3/uL  Lipid panel  Result Value Ref Range   Cholesterol, Total 228 (H) 100 - 199 mg/dL   Triglycerides 160 (H) 0 - 149 mg/dL   HDL 46 >39 mg/dL   VLDL Cholesterol Cal 29 5 - 40 mg/dL   LDL Chol Calc (NIH) 153 (H) 0 - 99 mg/dL   Chol/HDL Ratio 5.0 0.0 - 5.0 ratio  TSH  Result Value Ref Range   TSH 1.790 0.450 - 4.500 uIU/mL  Magnesium  Result Value Ref Range   Magnesium 2.0 1.6 - 2.3 mg/dL      Assessment & Plan:   Problem List Items Addressed This Visit  Other   Vitamin D deficiency   Mixed hyperlipidemia - Primary     Follow up plan: No follow-ups on file.

## 2022-06-25 LAB — TSH: TSH: 2.12 u[IU]/mL (ref 0.450–4.500)

## 2022-06-25 LAB — CBC WITH DIFFERENTIAL/PLATELET
Basophils Absolute: 0 10*3/uL (ref 0.0–0.2)
Basos: 1 %
EOS (ABSOLUTE): 0.1 10*3/uL (ref 0.0–0.4)
Eos: 2 %
Hematocrit: 45.8 % (ref 37.5–51.0)
Hemoglobin: 16 g/dL (ref 13.0–17.7)
Immature Grans (Abs): 0 10*3/uL (ref 0.0–0.1)
Immature Granulocytes: 0 %
Lymphocytes Absolute: 2.5 10*3/uL (ref 0.7–3.1)
Lymphs: 33 %
MCH: 29.8 pg (ref 26.6–33.0)
MCHC: 34.9 g/dL (ref 31.5–35.7)
MCV: 85 fL (ref 79–97)
Monocytes Absolute: 0.6 10*3/uL (ref 0.1–0.9)
Monocytes: 8 %
Neutrophils Absolute: 4.2 10*3/uL (ref 1.4–7.0)
Neutrophils: 56 %
Platelets: 286 10*3/uL (ref 150–450)
RBC: 5.37 x10E6/uL (ref 4.14–5.80)
RDW: 12.2 % (ref 11.6–15.4)
WBC: 7.4 10*3/uL (ref 3.4–10.8)

## 2022-06-25 LAB — LIPID PANEL
Chol/HDL Ratio: 4.8 ratio (ref 0.0–5.0)
Cholesterol, Total: 191 mg/dL (ref 100–199)
HDL: 40 mg/dL (ref >39)
LDL Chol Calc (NIH): 130 mg/dL — ABNORMAL HIGH (ref 0–99)
Triglycerides: 118 mg/dL (ref 0–149)
VLDL Cholesterol Cal: 21 mg/dL (ref 5–40)

## 2022-06-25 LAB — COMPREHENSIVE METABOLIC PANEL
ALT: 20 IU/L (ref 0–44)
AST: 16 IU/L (ref 0–40)
Albumin/Globulin Ratio: 2.1 (ref 1.2–2.2)
Albumin: 4.7 g/dL (ref 4.1–5.1)
Alkaline Phosphatase: 67 IU/L (ref 44–121)
BUN/Creatinine Ratio: 9 (ref 9–20)
BUN: 8 mg/dL (ref 6–20)
Bilirubin Total: 0.9 mg/dL (ref 0.0–1.2)
CO2: 21 mmol/L (ref 20–29)
Calcium: 9.6 mg/dL (ref 8.7–10.2)
Chloride: 100 mmol/L (ref 96–106)
Creatinine, Ser: 0.92 mg/dL (ref 0.76–1.27)
Globulin, Total: 2.2 g/dL (ref 1.5–4.5)
Glucose: 90 mg/dL (ref 70–99)
Potassium: 4.1 mmol/L (ref 3.5–5.2)
Sodium: 138 mmol/L (ref 134–144)
Total Protein: 6.9 g/dL (ref 6.0–8.5)
eGFR: 110 mL/min/{1.73_m2} (ref >59)

## 2022-06-25 LAB — VITAMIN D 25 HYDROXY (VIT D DEFICIENCY, FRACTURES): Vit D, 25-Hydroxy: 40 ng/mL (ref 30.0–100.0)

## 2022-06-27 NOTE — Progress Notes (Signed)
Hi Antonio Mueller. It was nice to see you last week.  Your lab work looks good.  Your cholesterol has improved from prior.  Your exercise and weight loss efforts are paying off.  No concerns at this time. Continue with your current medication regimen.  Follow up as discussed.  Please let me know if you have any questions.

## 2022-07-17 IMAGING — MR MR CERVICAL SPINE WO/W CM
8 series · 48 of 48 positions shown · IV contrast (10 mL Gadavist)
Comparison: None.

CLINICAL DATA: CLINICAL DATA
Right-sided neck, face, and jaw pain with stiffness for several
months.

EXAM:
MRI HEAD WITHOUT AND WITH CONTRAST
MRI CERVICAL SPINE WITHOUT AND WITH CONTRAST
TECHNIQUE: Multiplanar, multiecho pulse sequences of the brain and surrounding
structures, and cervical spine, to include the craniocervical
junction and cervicothoracic junction, were obtained without and
with intravenous contrast.
CONTRAST:  10mL GADAVIST GADOBUTROL 1 MMOL/ML IV SOLN

[Series 3: T2 · sagittal · 3.0mm · 0.86mm/px · 4 of 13 slices shown (1 of 2)]
[im 1/13]
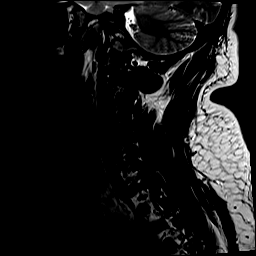
[im 5/13]
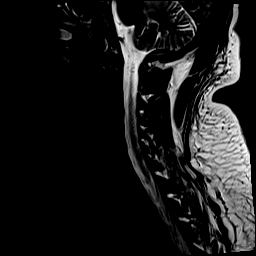
[im 9/13]
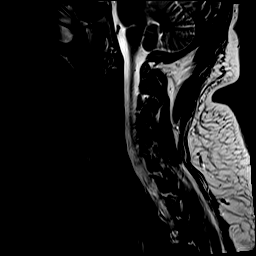
[im 13/13]
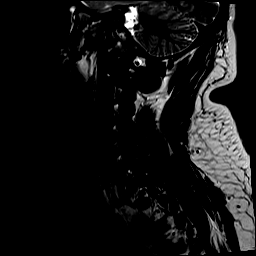

[Series 4: T1 · sagittal · 3.0mm · 0.86mm/px · 4 of 13 slices shown (1 of 2)]
[im 1/13]
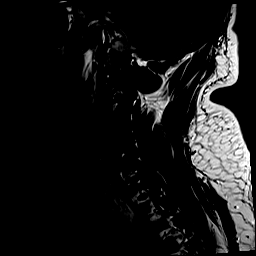
[im 5/13]
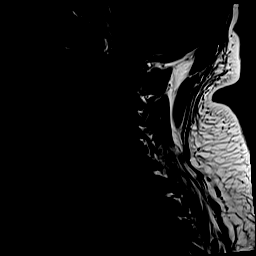
[im 9/13]
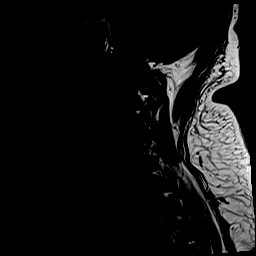
[im 13/13]
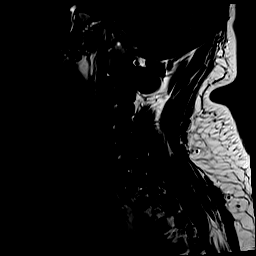

[Series 5: STIR · sagittal · 3.0mm · 0.86mm/px · 4 of 13 slices shown]
[im 1/13]
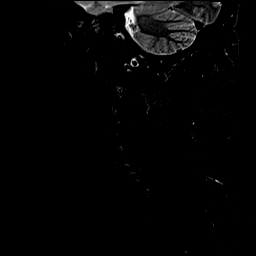
[im 5/13]
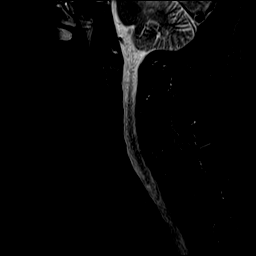
[im 9/13]
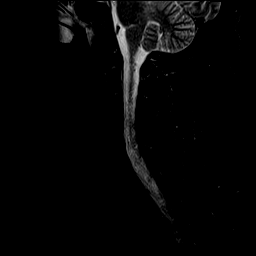
[im 13/13]
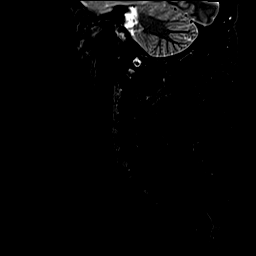

[Series 6: T2 · axial · 3.0mm · 0.78mm/px · z∈[-217,-130]mm · 8 of 25 slices shown (2 of 2)]
[im 1/25]
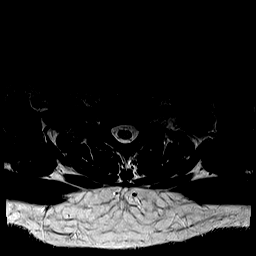
[im 4/25]
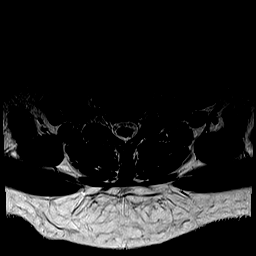
[im 7/25]
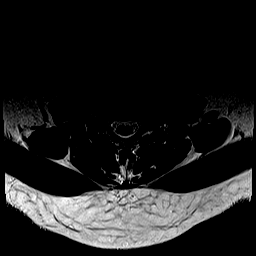
[im 11/25]
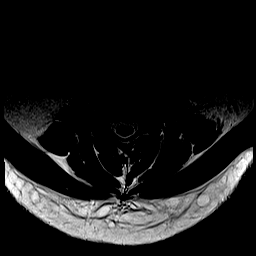
[im 14/25]
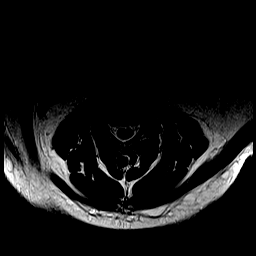
[im 18/25]
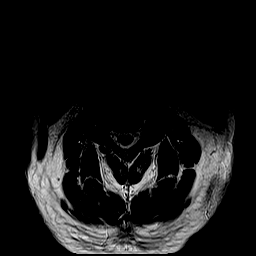
[im 21/25]
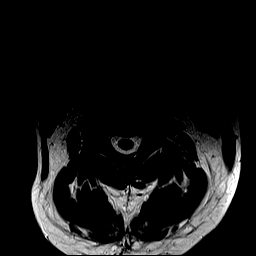
[im 25/25]
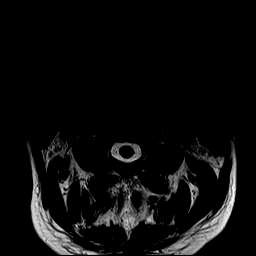

[Series 7: mpgr ax · axial · 3.0mm · 0.35mm/px · z∈[-205,-118]mm · 8 of 25 slices shown]
[im 1/25]
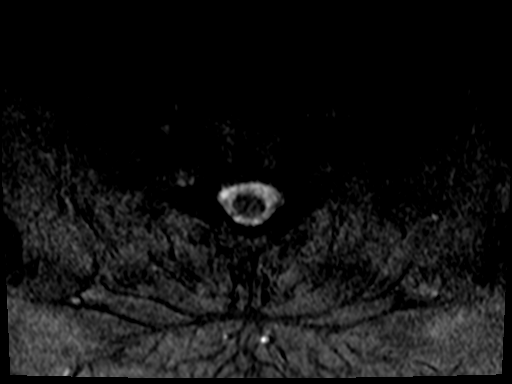
[im 4/25]
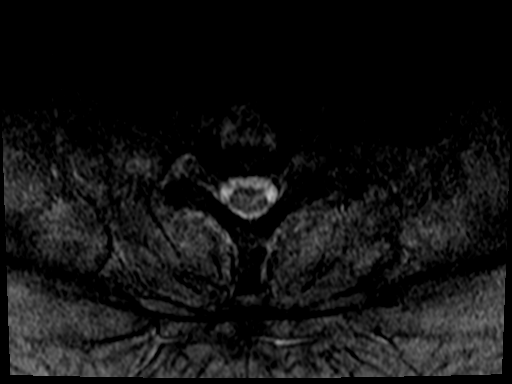
[im 7/25]
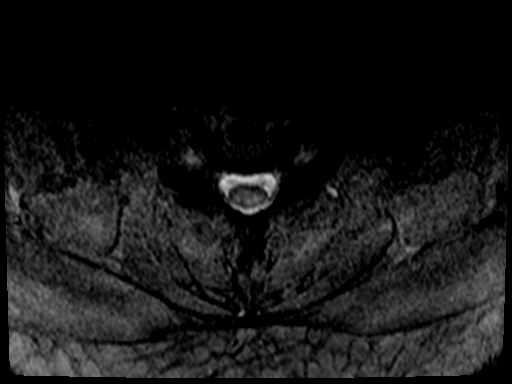
[im 11/25]
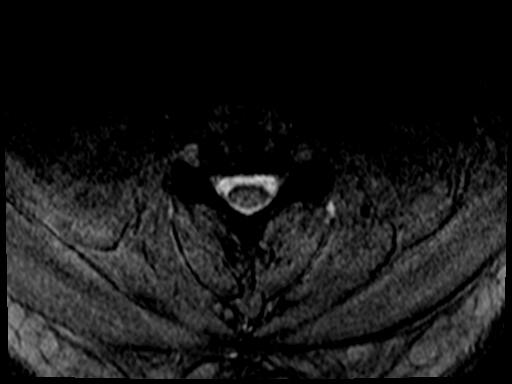
[im 14/25]
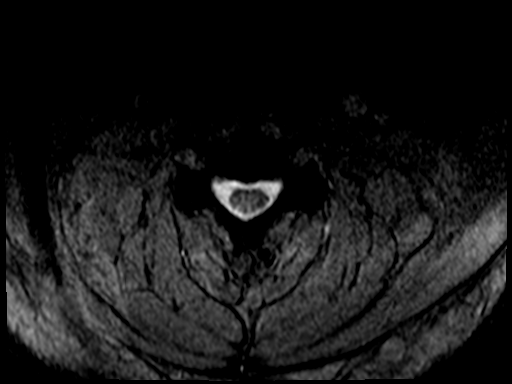
[im 18/25]
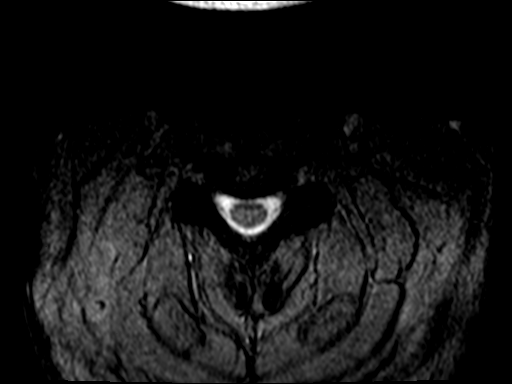
[im 21/25]
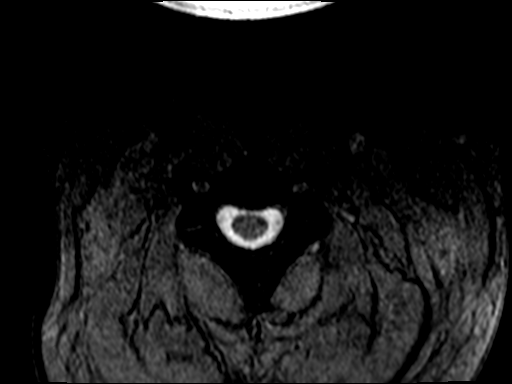
[im 25/25]
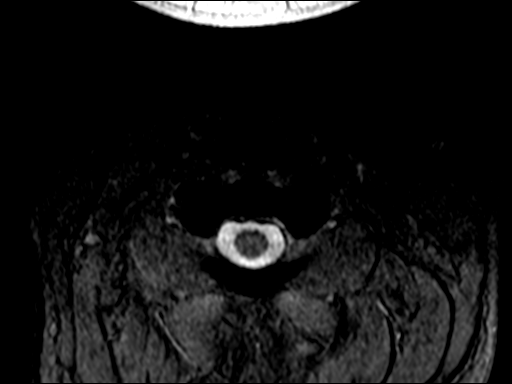

[Series 8: T1 · axial · non-contrast · 3.0mm · 0.78mm/px · z∈[-221,-126]mm · 8 of 27 slices shown (2 of 2)]
[im 1/27]
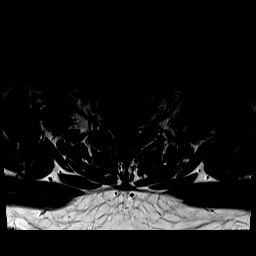
[im 4/27]
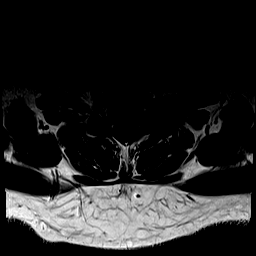
[im 8/27]
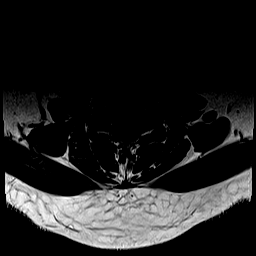
[im 12/27]
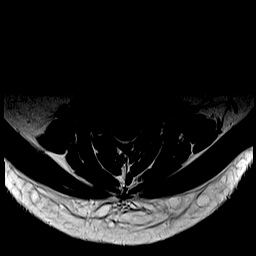
[im 15/27]
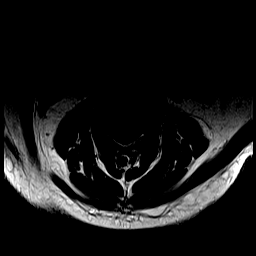
[im 19/27]
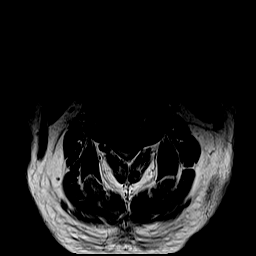
[im 23/27]
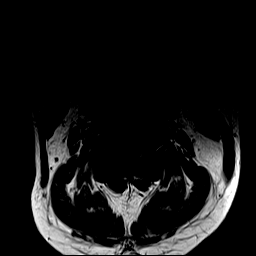
[im 27/27]
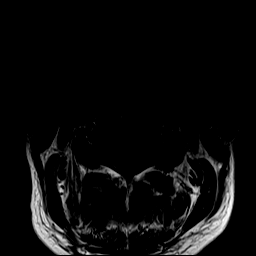

[Series 9: T1 fat-sat post-contrast · sagittal · 3.0mm · 0.86mm/px · 4 of 13 slices shown]
[im 1/13]
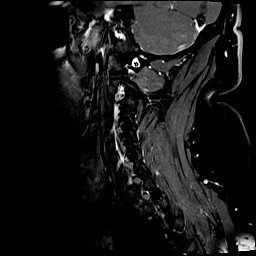
[im 5/13]
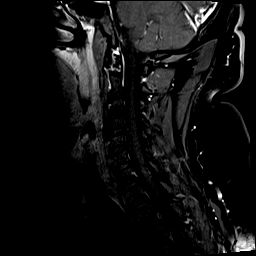
[im 9/13]
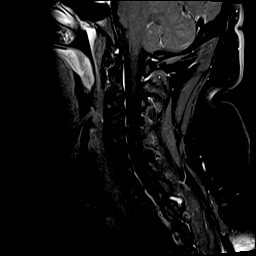
[im 13/13]
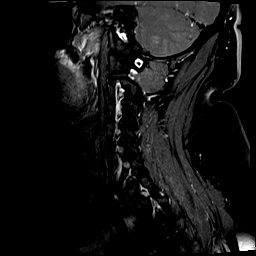

[Series 10: T1 post-contrast · axial · 3.0mm · 0.78mm/px · z∈[-221,-126]mm · 8 of 27 slices shown]
[im 1/27]
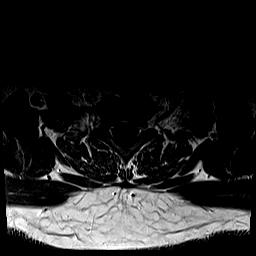
[im 4/27]
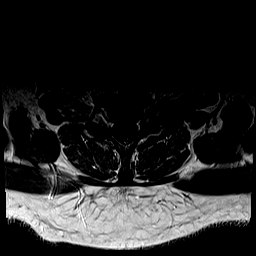
[im 8/27]
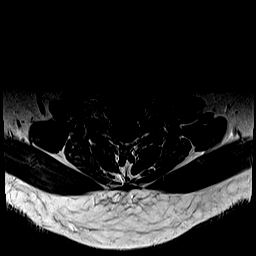
[im 12/27]
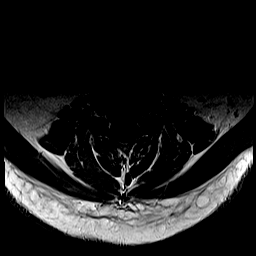
[im 15/27]
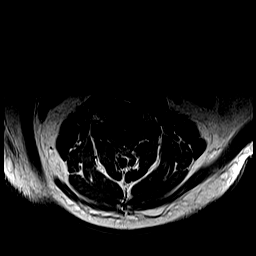
[im 19/27]
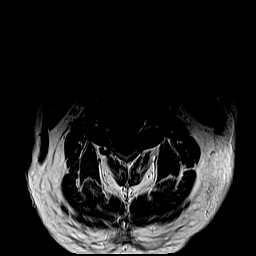
[im 23/27]
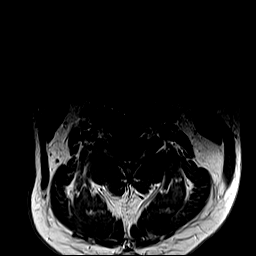
[im 27/27]
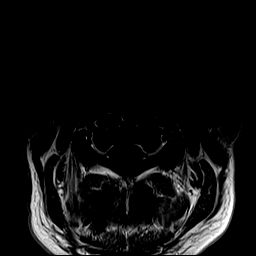

[48 of 48 positions shown; findings below may reference images not displayed]

FINDINGS: MRI HEAD FINDINGS

Brain: No infarction, hemorrhage, hydrocephalus, extra-axial
collection or mass lesion. No white matter disease, atrophy, or
abnormal enhancement. No abnormality seen at the trigeminal nerves.

Vascular: Normal flow voids.  Normal vascular enhancements

Skull and upper cervical spine: Is normal marrow signal

Sinuses/Orbits: Minor mucosal thickening at anterior ethmoid and
right frontal sinuses. Negative orbits

MRI CERVICAL SPINE FINDINGS

Alignment: Normal

Vertebrae: No fracture, evidence of discitis, or bone lesion.

Cord: Normal signal and morphology.

Posterior Fossa, vertebral arteries, paraspinal tissues: Negative.

Disc levels: Diffusely preserved disc height and hydration. No
significant facet spurring. No neural impingement.
IMPRESSION: Normal MRI of the brain and cervical spine.

## 2022-07-29 ENCOUNTER — Ambulatory Visit: Payer: Managed Care, Other (non HMO) | Admitting: Urology

## 2022-07-29 ENCOUNTER — Encounter: Payer: Self-pay | Admitting: Urology

## 2022-07-29 VITALS — BP 144/83 | HR 106 | Ht 69.0 in | Wt 255.0 lb

## 2022-07-29 DIAGNOSIS — N411 Chronic prostatitis: Secondary | ICD-10-CM

## 2022-07-29 DIAGNOSIS — G894 Chronic pain syndrome: Secondary | ICD-10-CM | POA: Diagnosis not present

## 2022-07-29 LAB — URINALYSIS, COMPLETE
Bilirubin, UA: NEGATIVE
Glucose, UA: NEGATIVE
Ketones, UA: NEGATIVE
Leukocytes,UA: NEGATIVE
Nitrite, UA: NEGATIVE
Protein,UA: NEGATIVE
RBC, UA: NEGATIVE
Specific Gravity, UA: 1.01 (ref 1.005–1.030)
Urobilinogen, Ur: 0.2 mg/dL (ref 0.2–1.0)
pH, UA: 7 (ref 5.0–7.5)

## 2022-07-29 LAB — MICROSCOPIC EXAMINATION: Bacteria, UA: NONE SEEN

## 2022-07-29 MED ORDER — ALFUZOSIN HCL ER 10 MG PO TB24: 10.0000 mg | ORAL_TABLET | Freq: Every day | ORAL | 3 refills | Status: DC

## 2022-07-30 ENCOUNTER — Encounter: Payer: Self-pay | Admitting: Urology

## 2022-07-30 NOTE — Progress Notes (Signed)
07/29/2022 9:05 PM   [name] 1986-01-02 [id number]  Referring provider: Jon Billings, NP [address] [place],  [place] [address]  Chief Complaint  Patient presents with   Prostatitis   Urologic history: 1.  Chronic prostatitis/chronic pelvic pain syndrome Pelvic and penile pain worse after intercourse which persisted for several weeks Urgency after ejaculation Lightheadedness with tamsulosin and sexual side effects with silodosin Cystoscopy 04/2021 with moderate lateral lobe enlargement and moderate bladder neck elevation  HPI: 36 y.o. male presents for follow-up visit.  Last seen 04/2021 for cystoscopy.  At that visit we discussed referral to PT for pelvic floor physical therapy which he was interested in pursuing however he states he was not contacted and his symptoms improved with decreased frequency of ejaculation He now has had progressive symptoms that are not related to ejaculation Complains of penile burning sensation, pelvic pain and urinary frequency/urgency No dysuria or gross hematuria   PMH: Past Medical History:  Diagnosis Date   Colon polyp    Hyperlipidemia    Lichen striatus    Lyme disease    Migraine headache     Surgical History: Past Surgical History:  Procedure Laterality Date   COLONOSCOPY  03/05/2018   Procedure: COLONOSCOPY;  Surgeon: Lin Landsman, MD;  Location: [facility] ENDOSCOPY;  Service: Gastroenterology;;   COLONOSCOPY W/ POLYPECTOMY     FLEXIBLE SIGMOIDOSCOPY     HERNIA REPAIR     1 as a child    Home Medications:  Allergies as of 07/29/2022       Reactions   Propofol    Other reaction(s): Other (See Comments) Memory problems   Nickel Rash        Medication List        Accurate as of July 29, 2022 11:59 PM. If you have any questions, ask your nurse or doctor.          alfuzosin 10 MG 24 hr tablet Commonly known as: UROXATRAL Take 1 tablet (10 mg total) by mouth daily with breakfast. Started by:  Abbie Sons, MD   Alpha-Lipoic Acid 300 MG Caps Take 1,200 mg by mouth daily.   bifidobacterium infantis capsule Take 1 capsule by mouth daily.   DIGESTIVE ADVANTAGE PO Take by mouth.   FIBER PO Take 5 capsules by mouth daily.   multivitamin with minerals tablet Take 1 tablet by mouth daily.   naproxen sodium 220 MG tablet Commonly known as: ALEVE Take 220 mg by mouth daily as needed.   VITAMIN D PO Take by mouth daily.        Allergies:  Allergies  Allergen Reactions   Propofol     Other reaction(s): Other (See Comments) Memory problems   Nickel Rash    Family History: Family History  Problem Relation Age of Onset   Thyroid disease Mother    Cancer Mother        Basal Cell Carinoma   Heart murmur Mother    Hepatitis C Father    Arthritis Father        Rheumatoid   Cancer Maternal Grandmother        Breast   Cancer Maternal Grandfather        bladder   Heart disease Maternal Grandfather    Alzheimer's disease Maternal Grandfather    Alzheimer's disease Paternal Grandmother    Heart disease Paternal Grandfather    Congestive Heart Failure Paternal Grandfather    Parkinson's disease Paternal Grandfather    Heart attack Cousin  Crohn's disease Cousin    Cancer Other        Colon Cancer   Cancer Other        Colon    Social History:  reports that he has never smoked. He has never used smokeless tobacco. He reports that he does not currently use alcohol. He reports that he does not use drugs.   Physical Exam: BP (!) 144/83   Pulse (!) 106   Ht '5\' 9"'$  (1.753 m)   Wt 255 lb (115.7 kg)   BMI 37.66 kg/m   Constitutional:  Alert and oriented, No acute distress. HEENT: [place] AT Respiratory: Normal respiratory effort, no increased work of breathing. Psychiatric: Normal mood and affect.  Laboratory Data:  Urinalysis Dipstick/microscopy negative  Assessment & Plan:    1. Chronic prostatitis/chronic pelvic pain syndrome Progressive symptoms  not related to ejaculation He was willing to retry tamsulosin or silodosin however will give a trial of alfuzosin 10 mg daily He also requested physical therapy referral   Abbie Sons, [place] [address], [place], [place] [address] [phone]

## 2022-11-03 ENCOUNTER — Ambulatory Visit: Payer: Managed Care, Other (non HMO) | Admitting: Internal Medicine

## 2022-11-29 ENCOUNTER — Ambulatory Visit: Payer: Managed Care, Other (non HMO) | Admitting: Nurse Practitioner

## 2022-11-29 ENCOUNTER — Encounter: Payer: Self-pay | Admitting: Nurse Practitioner

## 2022-11-29 VITALS — BP 112/72 | HR 81 | Temp 98.5°F | Wt 258.2 lb

## 2022-11-29 DIAGNOSIS — T7840XA Allergy, unspecified, initial encounter: Secondary | ICD-10-CM | POA: Diagnosis not present

## 2022-11-29 NOTE — Progress Notes (Signed)
BP 112/72   Pulse 81   Temp 98.5 F (36.9 C) (Oral)   Wt 258 lb 3.2 oz (117.1 kg)   SpO2 98%   BMI 38.13 kg/m    Subjective:    Patient ID: Antonio Mueller, male    DOB: 18-Dec-1985, 36 y.o.   MRN: [id number]  HPI: Antonio Mueller is a 36 y.o. male  Chief Complaint  Patient presents with   Allergic Reaction    Pt states he drank a drink that had alcohol this past weekend and had a reaction. States his face and neck turned red and he developed a rash in the area. States he was drinking a drink with vodka and ginger beer. States he took zyrtec that seemed to help some. States symptoms lasted for about 15 minutes after he took the zyrtec.    Pt states he drank a drink that had alcohol this past weekend and had a reaction. States his face and neck turned red and he developed a rash in the area and he had gotten really hot. States he was drinking a drink with vodka and ginger beer. States he took zyrtec that seemed to help some. States symptoms lasted for about 15 minutes after he took the zyrtec.  He tried another drink the next night and the same symptoms reoccurred.     Denies HA, CP, SOB, dizziness, palpitations, visual changes, and lower extremity swelling.      Relevant past medical, surgical, family and social history reviewed and updated as indicated. Interim medical history since our last visit reviewed. Allergies and medications reviewed and updated.  Review of Systems  Skin:  Positive for rash.    Per HPI unless specifically indicated above     Objective:    BP 112/72   Pulse 81   Temp 98.5 F (36.9 C) (Oral)   Wt 258 lb 3.2 oz (117.1 kg)   SpO2 98%   BMI 38.13 kg/m   Wt Readings from Last 3 Encounters:  11/29/22 258 lb 3.2 oz (117.1 kg)  07/29/22 255 lb (115.7 kg)  06/24/22 255 lb 14.4 oz (116.1 kg)    Physical Exam Vitals and nursing note reviewed.  Constitutional:      General: He is not in acute distress.    Appearance: Normal appearance. He is obese.  He is not ill-appearing, toxic-appearing or diaphoretic.  HENT:     Head: Normocephalic.     Right Ear: External ear normal.     Left Ear: External ear normal.     Nose: Nose normal. No congestion or rhinorrhea.     Mouth/Throat:     Mouth: Mucous membranes are moist.  Eyes:     General:        Right eye: No discharge.        Left eye: No discharge.     Extraocular Movements: Extraocular movements intact.     Conjunctiva/sclera: Conjunctivae normal.     Pupils: Pupils are equal, round, and reactive to light.  Cardiovascular:     Rate and Rhythm: Normal rate and regular rhythm.     Heart sounds: No murmur heard. Pulmonary:     Effort: Pulmonary effort is normal. No respiratory distress.     Breath sounds: Normal breath sounds. No wheezing, rhonchi or rales.  Abdominal:     General: Abdomen is flat. Bowel sounds are normal.  Musculoskeletal:     Cervical back: Normal range of motion and neck supple.  Skin:    General: Skin  is warm and dry.     Capillary Refill: Capillary refill takes less than 2 seconds.  Neurological:     General: No focal deficit present.     Mental Status: He is alert and oriented to person, place, and time.  Psychiatric:        Mood and Affect: Mood normal.        Behavior: Behavior normal.        Thought Content: Thought content normal.        Judgment: Judgment normal.     Results for orders placed or performed in visit on 07/29/22  Microscopic Examination   Urine  Result Value Ref Range   WBC, UA 0-5 0 - 5 /hpf   RBC, Urine 0-2 0 - 2 /hpf   Epithelial Cells (non renal) 0-10 0 - 10 /hpf   Bacteria, UA None seen None seen/Few  Urinalysis, Complete  Result Value Ref Range   Specific Gravity, UA 1.010 1.005 - 1.030   pH, UA 7.0 5.0 - 7.5   Color, UA Yellow Yellow   Appearance Ur Clear Clear   Leukocytes,UA Negative Negative   Protein,UA Negative Negative/Trace   Glucose, UA Negative Negative   Ketones, UA Negative Negative   RBC, UA Negative  Negative   Bilirubin, UA Negative Negative   Urobilinogen, Ur 0.2 0.2 - 1.0 mg/dL   Nitrite, UA Negative Negative   Microscopic Examination See below:       Assessment & Plan:   Problem List Items Addressed This Visit   None Visit Diagnoses     Allergy, initial encounter    -  Primary   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  Recommend avoiding alcohol.  Rarely drinks.        Follow up plan: Return if symptoms worsen or fail to improve.

## 2022-12-28 ENCOUNTER — Ambulatory Visit: Payer: Managed Care, Other (non HMO) | Admitting: Nurse Practitioner

## 2023-03-20 NOTE — Progress Notes (Unsigned)
   There were no vitals taken for this visit.   Subjective:    Patient ID: Antonio Mueller, male    DOB: 1986-09-29, 37 y.o.   MRN: [id number]  HPI: [name] is a 37 y.o. male  No chief complaint on file.   Relevant past medical, surgical, family and social history reviewed and updated as indicated. Interim medical history since our last visit reviewed. Allergies and medications reviewed and updated.  Review of Systems  Per HPI unless specifically indicated above     Objective:    There were no vitals taken for this visit.  Wt Readings from Last 3 Encounters:  11/29/22 258 lb 3.2 oz (117.1 kg)  07/29/22 255 lb (115.7 kg)  06/24/22 255 lb 14.4 oz (116.1 kg)    Physical Exam  Results for orders placed or performed in visit on 07/29/22  Microscopic Examination   Urine  Result Value Ref Range   WBC, UA 0-5 0 - 5 /hpf   RBC, Urine 0-2 0 - 2 /hpf   Epithelial Cells (non renal) 0-10 0 - 10 /hpf   Bacteria, UA None seen None seen/Few  Urinalysis, Complete  Result Value Ref Range   Specific Gravity, UA 1.010 1.005 - 1.030   pH, UA 7.0 5.0 - 7.5   Color, UA Yellow Yellow   Appearance Ur Clear Clear   Leukocytes,UA Negative Negative   Protein,UA Negative Negative/Trace   Glucose, UA Negative Negative   Ketones, UA Negative Negative   RBC, UA Negative Negative   Bilirubin, UA Negative Negative   Urobilinogen, Ur 0.2 0.2 - 1.0 mg/dL   Nitrite, UA Negative Negative   Microscopic Examination See below:       Assessment & Plan:   Problem List Items Addressed This Visit   None    Follow up plan: No follow-ups on file.

## 2023-03-21 ENCOUNTER — Ambulatory Visit: Payer: Managed Care, Other (non HMO) | Admitting: Nurse Practitioner

## 2023-03-21 ENCOUNTER — Encounter: Payer: Self-pay | Admitting: Nurse Practitioner

## 2023-03-21 VITALS — BP 127/75 | HR 108 | Temp 98.0°F | Wt 263.1 lb

## 2023-03-21 DIAGNOSIS — E559 Vitamin D deficiency, unspecified: Secondary | ICD-10-CM | POA: Diagnosis not present

## 2023-03-21 DIAGNOSIS — R6884 Jaw pain: Secondary | ICD-10-CM | POA: Diagnosis not present

## 2023-03-21 DIAGNOSIS — R232 Flushing: Secondary | ICD-10-CM

## 2023-03-21 DIAGNOSIS — E669 Obesity, unspecified: Secondary | ICD-10-CM

## 2023-03-21 DIAGNOSIS — M6289 Other specified disorders of muscle: Secondary | ICD-10-CM | POA: Diagnosis not present

## 2023-03-21 DIAGNOSIS — R7309 Other abnormal glucose: Secondary | ICD-10-CM

## 2023-03-21 DIAGNOSIS — R768 Other specified abnormal immunological findings in serum: Secondary | ICD-10-CM

## 2023-03-21 NOTE — Assessment & Plan Note (Signed)
Labs ordered at visit today.  Will make recommendations based on lab results.   

## 2023-03-21 NOTE — Assessment & Plan Note (Signed)
Recommended eating smaller high protein, low fat meals more frequently and exercising 30 mins a day 5 times a week with a goal of 10-15lb weight loss in the next 3 months.  

## 2023-03-22 LAB — CBC WITH DIFFERENTIAL/PLATELET
Basophils Absolute: 0.1 10*3/uL (ref 0.0–0.2)
Hemoglobin: 16.8 g/dL (ref 13.0–17.7)
Lymphs: 27 %
Neutrophils: 61 %
WBC: 7.3 10*3/uL (ref 3.4–10.8)

## 2023-03-24 ENCOUNTER — Other Ambulatory Visit: Payer: Managed Care, Other (non HMO)

## 2023-03-24 DIAGNOSIS — R7309 Other abnormal glucose: Secondary | ICD-10-CM

## 2023-03-24 DIAGNOSIS — R232 Flushing: Secondary | ICD-10-CM

## 2023-03-24 LAB — CBC WITH DIFFERENTIAL/PLATELET
Basos: 1 %
EOS (ABSOLUTE): 0.2 10*3/uL (ref 0.0–0.4)
Eos: 3 %
Hematocrit: 50.2 % (ref 37.5–51.0)
Immature Grans (Abs): 0 10*3/uL (ref 0.0–0.1)
Immature Granulocytes: 0 %
Lymphocytes Absolute: 2 10*3/uL (ref 0.7–3.1)
MCH: 29.3 pg (ref 26.6–33.0)
MCHC: 33.5 g/dL (ref 31.5–35.7)
MCV: 88 fL (ref 79–97)
Monocytes Absolute: 0.6 10*3/uL (ref 0.1–0.9)
Monocytes: 8 %
Neutrophils Absolute: 4.5 10*3/uL (ref 1.4–7.0)
Platelets: 276 10*3/uL (ref 150–450)
RBC: 5.73 x10E6/uL (ref 4.14–5.80)
RDW: 12.5 % (ref 11.6–15.4)

## 2023-03-24 LAB — ANA+ENA+DNA/DS+ANTICH+CENTR
ANA Titer 1: NEGATIVE
Anti JO-1: <0.2 AI (ref 0.0–0.9)
Centromere Ab Screen: <0.2 AI (ref 0.0–0.9)
Chromatin Ab SerPl-aCnc: <0.2 AI (ref 0.0–0.9)
ENA RNP Ab: <0.2 AI (ref 0.0–0.9)
ENA SM Ab Ser-aCnc: <0.2 AI (ref 0.0–0.9)
ENA SSA (RO) Ab: <0.2 AI (ref 0.0–0.9)
ENA SSB (LA) Ab: <0.2 AI (ref 0.0–0.9)
Scleroderma (Scl-70) (ENA) Antibody, IgG: <0.2 AI (ref 0.0–0.9)
dsDNA Ab: <1 IU/mL (ref 0–9)

## 2023-03-24 LAB — COMPREHENSIVE METABOLIC PANEL WITH GFR
ALT: 31 IU/L (ref 0–44)
AST: 17 IU/L (ref 0–40)
Albumin/Globulin Ratio: 2.1 (ref 1.2–2.2)
Albumin: 4.6 g/dL (ref 4.1–5.1)
Alkaline Phosphatase: 66 IU/L (ref 44–121)
BUN/Creatinine Ratio: 12 (ref 9–20)
BUN: 12 mg/dL (ref 6–20)
Bilirubin Total: 0.7 mg/dL (ref 0.0–1.2)
CO2: 20 mmol/L (ref 20–29)
Calcium: 10.1 mg/dL (ref 8.7–10.2)
Chloride: 100 mmol/L (ref 96–106)
Creatinine, Ser: 1.04 mg/dL (ref 0.76–1.27)
Globulin, Total: 2.2 g/dL (ref 1.5–4.5)
Glucose: 137 mg/dL — ABNORMAL HIGH (ref 70–99)
Potassium: 4.4 mmol/L (ref 3.5–5.2)
Sodium: 139 mmol/L (ref 134–144)
Total Protein: 6.8 g/dL (ref 6.0–8.5)
eGFR: 95 mL/min/1.73

## 2023-03-24 LAB — EHRLICHIA ANTIBODY PANEL
E. Chaffeensis (HME) IgM Titer: NEGATIVE
E.Chaffeensis (HME) IgG: NEGATIVE
HGE IgG Titer: NEGATIVE
HGE IgM Titer: NEGATIVE

## 2023-03-24 LAB — THYROID PEROXIDASE ANTIBODY: Thyroperoxidase Ab SerPl-aCnc: 129 IU/mL — ABNORMAL HIGH (ref 0–34)

## 2023-03-24 LAB — VITAMIN D 25 HYDROXY (VIT D DEFICIENCY, FRACTURES): Vit D, 25-Hydroxy: 33.1 ng/mL (ref 30.0–100.0)

## 2023-03-24 LAB — RHEUMATOID FACTOR: Rheumatoid fact SerPl-aCnc: <10 IU/mL (ref <14.0)

## 2023-03-24 LAB — THYROID PANEL WITH TSH
Free Thyroxine Index: 3.2 (ref 1.2–4.9)
T3 Uptake Ratio: 31 % (ref 24–39)
T4, Total: 10.4 ug/dL (ref 4.5–12.0)
TSH: 2.42 u[IU]/mL (ref 0.450–4.500)

## 2023-03-24 LAB — ANA: Anti Nuclear Antibody (ANA): NEGATIVE

## 2023-03-24 LAB — URIC ACID: Uric Acid: 9 mg/dL — ABNORMAL HIGH (ref 3.8–8.4)

## 2023-03-24 LAB — CK: Total CK: 77 U/L (ref 49–439)

## 2023-03-24 NOTE — Progress Notes (Signed)
HI Antonio Mueller. Your lab work has returned.  Your glucose is elevated so I would like you to come back and check an A1c to rule out diabetes.  I do not think this is the cause of your symptoms but a finding we need to pursue anyway.  There is no evidence of a Rheumatology issue.  However, your thyroid antibodies are significantly elevated and indicative of possible Hashimoto's which is an autoimmune thyroid problem.  I have placed a referral for you to see Endocrinology for this.  It will take a little bit to get in to see them.  However, in the mean time, I would like to rule out an adrenal gland issue given your symptoms.  I have placed lab orders for urine tests which will help do this.  I also ordered an ultrasound of your kidney which will include your adrenal gland.  Please call back and make a lab appt to come back and have those repeated.  They will call you to set up the ultrasound.  Your uric acid came back a little bit elevated and I do think we should start Allopurinol but I would like to complete the other work up first.  If you have further questions, please send me a message.

## 2023-03-24 NOTE — Addendum Note (Signed)
Addended by: Larae Grooms on: 03/24/2023 09:50 AM   Modules accepted: Orders

## 2023-03-25 LAB — HEMOGLOBIN A1C
Est. average glucose Bld gHb Est-mCnc: 108 mg/dL
Hgb A1c MFr Bld: 5.4 % (ref 4.8–5.6)

## 2023-03-29 ENCOUNTER — Ambulatory Visit
Admission: RE | Admit: 2023-03-29 | Discharge: 2023-03-29 | Disposition: A | Discharge status: Home / Self Care | Payer: Managed Care, Other (non HMO) | Source: Ambulatory Visit | Attending: Nurse Practitioner | Admitting: Nurse Practitioner

## 2023-03-29 DIAGNOSIS — R232 Flushing: Secondary | ICD-10-CM | POA: Diagnosis present

## 2023-03-29 DIAGNOSIS — M6289 Other specified disorders of muscle: Secondary | ICD-10-CM | POA: Diagnosis not present

## 2023-03-30 NOTE — Addendum Note (Signed)
Addended by: Larae Grooms on: 03/30/2023 04:34 PM   Modules accepted: Orders

## 2023-03-30 NOTE — Progress Notes (Signed)
Hi Matt.  Your adrenal gland looks good. But they would like to look at it better through MRI.  I have ordered an MRI with IV contrast to look at this further. I spoke with the lab and they have the container for your urine sample.  Are you able to come back and pick it up?

## 2023-04-02 ENCOUNTER — Encounter: Payer: Self-pay | Admitting: Nurse Practitioner

## 2023-04-02 DIAGNOSIS — K921 Melena: Secondary | ICD-10-CM

## 2023-04-05 ENCOUNTER — Other Ambulatory Visit: Payer: Managed Care, Other (non HMO)

## 2023-04-06 LAB — METANEPHRINES, URINE, 24 HOUR
Metaneph Total, Ur: 59 ug/L
Metanephrines, 24H Ur: 145 ug/24 hr (ref 58–276)
Normetanephrine, 24H Ur: 272 ug/24 hr (ref 156–729)
Normetanephrine, Ur: 111 ug/L

## 2023-04-07 LAB — CATECHOLAMINES, FRACTIONATED, URINE, 24 HOUR
Dopamine , 24H Ur: 323 ug/24 hr (ref 0–510)
Dopamine, Rand Ur: 132 ug/L
Epinephrine, 24H Ur: 7 ug/24 hr (ref 0–20)
Epinephrine, Rand Ur: 3 ug/L
Norepinephrine, 24H Ur: 37 ug/24 hr (ref 0–135)
Norepinephrine, Rand Ur: 15 ug/L

## 2023-04-12 ENCOUNTER — Ambulatory Visit: Payer: Managed Care, Other (non HMO) | Admitting: Dermatology

## 2023-04-12 NOTE — Progress Notes (Signed)
HI Antonio Mueller.  Your lab work looks good.  No abnormal results at this time indicating the there is not a problem with the adrenal gland.  We will proceed with the MRI to ensure everything is normal.

## 2023-05-02 NOTE — Progress Notes (Signed)
Celso Amy, PA-C [address]  [address]  [place], [place] [address]  Main: [phone]  Fax: [phone]   Gastroenterology Consultation  Referring Provider:     Larae Grooms, NP Primary Care Physician:  Larae Grooms, NP Primary Gastroenterologist:  Dr. Lannette Donath / Celso Amy, PA-C  Reason for Consultation:     Blood in Stool        HPI:   Antonio Mueller is a 37 y.o. y/o male referred for consultation & management  by Larae Grooms, NP.  Referred to evaluate rectal bleeding.  Hx of intermittent rectal bleeding for many years.  He last saw Dr. Allegra Lai for Rectal bleeding 10/2019.  He was treated for Hemorrhoids.  Also has history of large inflammatory rectal polyp.  Patient states for the past 2.5 months he has noticed a lot of mucus in his stools and episode of blood in his stools once per week.  He has had episodes of loose urgent bowel movements.  Streaks of dark blood in his stools.  He has had intermittent bilateral lower abdominal discomfort which comes and goes.  He denies unintentional weight loss.  He has family history of a cousin who had Crohn's disease.  Patient states he had a lot of stomach issues as a child.  Diagnosed with ulcerative colitis and took treatment for 1 year as a child.  Colitis resolved and then he was diagnosed with irritable bowel syndrome.  He has not taken any treatment for ulcerative colitis since childhood.  Lab 03/21/23 showed normal CBC with Hgb 16.8g.  GI Procedures:  Colonoscopy at [facility], 12/13/2016 The colon mucosa appeared normal in the terminal ileum and throughout the entire examined colon. Multiple random biopsies were performed. A 7 mm semi-pedunculated polyp was found in the sigmoid colon, polypectomy was performed with the cold snare.retroflexion in the rectum was normal. Random biopsies in the colon showed normal mucosa with no diagnostic abnormalities. Sigmoid colon Polyp inflammatory  pseudopolyp   Colonoscopy 03/05/2018 by Dr. Allegra Lai: - One 30 mm polyp in the rectum at 20 cm proximal to the anus, removed with a hot snare. Resected and retrieved. Tattooed. Source of rectal bleeding - The distal rectum and anal verge are normal on retroflexion view. - Normal mucosa in the entire examined colon. - The examined portion of the ileum was normal.   DIAGNOSIS:  A.  INFLAMMATORY POLYP OF RECTUM; HOT SNARE:  - POLYPOID COLONIC MUCOSA WITH EXUDATE, GRANULATION TISSUE, INFLAMMATION  OF THE LAMINA PROPRIA, VASCULAR CONGESTION AND HYPERPLASTIC CHANGES.  - NEGATIVE FOR DYSPLASIA AND MALIGNANCY.   Past Medical History:  Diagnosis Date   Colon polyp    Hyperlipidemia    Lichen striatus    Lyme disease    Migraine headache     Past Surgical History:  Procedure Laterality Date   COLONOSCOPY  03/05/2018   Procedure: COLONOSCOPY;  Surgeon: Toney Reil, MD;  Location: [facility] ENDOSCOPY;  Service: Gastroenterology;;   COLONOSCOPY W/ POLYPECTOMY     FLEXIBLE SIGMOIDOSCOPY     HERNIA REPAIR     1 as a child    Prior to Admission medications   Medication Sig Start Date End Date Taking? Authorizing Provider  bifidobacterium infantis (ALIGN) capsule Take 1 capsule by mouth daily.    [provider]  FIBER PO Take 5 capsules by mouth daily.    [provider]  naproxen sodium (ALEVE) 220 MG tablet Take 220 mg by mouth daily as needed.  [provider]  Probiotic Product (DIGESTIVE ADVANTAGE PO) Take by mouth.    [provider]  VITAMIN D PO Take by mouth daily.    [provider]    Family History  Problem Relation Age of Onset   Thyroid disease Mother    Cancer Mother        Basal Cell Carinoma   Heart murmur Mother    Hepatitis C Father    Arthritis Father        Rheumatoid   Cancer Maternal Grandmother        Breast   Cancer Maternal Grandfather        bladder   Heart disease Maternal Grandfather    Alzheimer's  disease Maternal Grandfather    Alzheimer's disease Paternal Grandmother    Heart disease Paternal Grandfather    Congestive Heart Failure Paternal Grandfather    Parkinson's disease Paternal Grandfather    Heart attack Cousin    Crohn's disease Cousin    Cancer Other        Colon Cancer   Cancer Other        Colon     Social History   Tobacco Use   Smoking status: Never   Smokeless tobacco: Never  Vaping Use   Vaping Use: Never used  Substance Use Topics   Alcohol use: Not Currently    Comment: 1 drink per year during holidays   Drug use: No    Allergies as of 05/03/2023 - Review Complete 05/03/2023  Allergen Reaction Noted   Ethanol-alcohol [ethanol]  11/29/2022   Propofol  08/08/2018   Nickel Rash 06/05/2018    Review of Systems:    All systems reviewed and negative except where noted in HPI.   Physical Exam:  BP (!) 132/92   Pulse 96   Temp 98.2 F (36.8 C)   Ht 5\' 10"  (1.778 m)   Wt 265 lb 9.6 oz (120.5 kg)   BMI 38.11 kg/m  No LMP for male patient. Psych:  Alert and cooperative. Normal mood and affect. General:   Alert,  Well-developed, well-nourished, pleasant and cooperative in NAD Head:  Normocephalic and atraumatic. Eyes:  Sclera clear, no icterus.   Conjunctiva pink. Neck:  Supple; no masses or thyromegaly. Lungs:  Respirations even and unlabored.  Clear throughout to auscultation.   No wheezes, crackles, or rhonchi. No acute distress. Heart:  Regular rate and rhythm; no murmurs, clicks, rubs, or gallops. Abdomen:  Normal bowel sounds.  No bruits.  Soft, and obese without masses, hepatosplenomegaly or hernias noted.  No Tenderness.  No guarding or rebound tenderness.    Neurologic:  Alert and oriented x3;  grossly normal neurologically. Psych:  Alert and cooperative. Normal mood and affect.  Imaging Studies: No results found.  Assessment and Plan:   Antonio Mueller is a 37 y.o. y/o male has been referred for Rectal Bleeding.  Rectal  Bleeding History of large inflammatory Rectal polyp (03/2018). Loose Stool with Mucus - Evaluate for IBD verses IBS.  Plan:   Scheduling Colonoscopy I discussed risks of colonoscopy with patient to include risk of bleeding, colon perforation, and risk of sedation.  Patient expressed understanding and agrees to proceed with colonoscopy.   Order Fecal Calprotectin Stool Test.  Continue Probiotic and Fiber daily.  Follow up in 4 weeks after Procedure with Dr. Allegra Lai or TG.  Celso Amy, PA-C

## 2023-05-03 ENCOUNTER — Ambulatory Visit: Payer: Managed Care, Other (non HMO) | Admitting: Physician Assistant

## 2023-05-03 ENCOUNTER — Encounter: Payer: Self-pay | Admitting: Physician Assistant

## 2023-05-03 ENCOUNTER — Other Ambulatory Visit: Payer: Self-pay

## 2023-05-03 ENCOUNTER — Telehealth: Payer: Self-pay

## 2023-05-03 VITALS — BP 132/92 | HR 96 | Temp 98.2°F | Ht 70.0 in | Wt 265.6 lb

## 2023-05-03 DIAGNOSIS — K625 Hemorrhage of anus and rectum: Secondary | ICD-10-CM

## 2023-05-03 DIAGNOSIS — R195 Other fecal abnormalities: Secondary | ICD-10-CM

## 2023-05-03 DIAGNOSIS — Z86010 Personal history of colonic polyps: Secondary | ICD-10-CM | POA: Diagnosis not present

## 2023-05-03 MED ORDER — PEG 3350-KCL-NA BICARB-NACL 420 G PO SOLR: 4000.0000 mL | Freq: Once | ORAL | 0 refills | Status: AC

## 2023-05-03 NOTE — Telephone Encounter (Signed)
  Patient notified and he will come next week for additional lab work.    Call patient and notify Dr. Allegra Lai wants him to do more stool studies.  Call patient and order more stool studies: GI pathogen panel and C. difficile toxin PCR.  Diagnosis loose stools.  If stool test are negative for infections, then we will proceed with colonoscopy as scheduled.  Thank you,

## 2023-05-11 LAB — GI PROFILE, STOOL, PCR

## 2023-05-11 LAB — CLOSTRIDIUM DIFFICILE BY PCR: Toxigenic C. Difficile by PCR: NEGATIVE

## 2023-05-12 ENCOUNTER — Telehealth: Payer: Self-pay

## 2023-05-12 LAB — GI PROFILE, STOOL, PCR
Enteroaggregative E coli: NOT DETECTED
Norovirus GI/GII: NOT DETECTED
Salmonella: NOT DETECTED
Vibrio cholerae: NOT DETECTED

## 2023-05-12 NOTE — Telephone Encounter (Signed)
Left detailed message on VM.  Notify patient stool studies are negative for all infections.  GI pathogen panel and C. difficile stool test are negative.  No infection to cause diarrhea.  Continue with plan.

## 2023-05-12 NOTE — Progress Notes (Signed)
Notify patient stool studies are negative for all infections.  GI pathogen panel and C. difficile stool test are negative.  No infection to cause diarrhea.  Continue with plan.

## 2023-05-16 ENCOUNTER — Telehealth: Payer: Self-pay

## 2023-05-16 NOTE — Telephone Encounter (Signed)
Pt left message has question in ref to sedation for colonoscopy please return call

## 2023-05-17 ENCOUNTER — Telehealth: Payer: Self-pay

## 2023-05-17 LAB — GI PROFILE, STOOL, PCR
C difficile toxin A/B: NOT DETECTED
Campylobacter: NOT DETECTED
Cryptosporidium: NOT DETECTED
Entamoeba histolytica: NOT DETECTED
Enteropathogenic E coli: NOT DETECTED
Enterotoxigenic E coli: NOT DETECTED
Giardia lamblia: NOT DETECTED
Plesiomonas shigelloides: NOT DETECTED
Rotavirus A: NOT DETECTED
Shiga-toxin-producing E coli: NOT DETECTED
Shigella/Enteroinvasive E coli: NOT DETECTED
Vibrio: NOT DETECTED

## 2023-05-17 LAB — CALPROTECTIN, FECAL: Calprotectin, Fecal: 17 ug/g (ref 0–120)

## 2023-05-17 NOTE — Telephone Encounter (Signed)
error 

## 2023-05-17 NOTE — Telephone Encounter (Signed)
Patient states he has had some problems with propofol and wanted to discuss this with anesthesia. Propofol is listed as allergie in chart.   He would like to speak with anesthesiologist prior to having procedures to assure there are other alternatives.   I will call endo unit and see if someone can look into his chart and have someone reach out to him.

## 2023-05-17 NOTE — Progress Notes (Signed)
Notify patient fecal calprotectin stool test is normal.  No evidence of bowel inflammation or IBD.  Continue with current plan.

## 2023-05-18 ENCOUNTER — Telehealth: Payer: Self-pay

## 2023-05-18 NOTE — Telephone Encounter (Signed)
  Patient notified.   Notify patient fecal calprotectin stool test is normal.  No evidence of bowel inflammation or IBD.  Continue with current plan.

## 2023-06-21 ENCOUNTER — Telehealth: Payer: Self-pay | Admitting: Gastroenterology

## 2023-06-21 ENCOUNTER — Telehealth: Payer: Self-pay

## 2023-06-21 NOTE — Telephone Encounter (Signed)
Left message for patient letting him know Dr.Vanga is out of office this week and I have sent her a message letting her know that he is requesting Colonoscopy to be without anesthesia We will be back in touch once Dr.Vanga reads the message. Antonio Mueller

## 2023-06-21 NOTE — Telephone Encounter (Signed)
Patient don't want anesthesia.

## 2023-06-21 NOTE — Telephone Encounter (Signed)
-----   Message from [facility] [place] W sent at 06/21/2023  2:26 PM EDT ----- Regarding: Colonoscopy without anesthesia Patient has scheduled Colonoscopy w/Dr.Vanga 06/28/23 and does NOT want anesthesia. Please advise. Thank you. I have called patient to let him know I will be asking you if you will proceed without anesthesia. Thank you.

## 2023-06-21 NOTE — Telephone Encounter (Signed)
Called patient to get more information on why he did not want anesthesia. He states that he talk to the anesthesiologist at [facility] endo unit and he talk to Dr. Allegra Lai and she said she was okay with doing the colonoscopy with out anesthesia. He states that several years ago he had a procedure with peopofol and had severe memory loss for several months.he states during this time of memory loss he loss his job and it was a horrible time in his life and never wants to go through that again. He states he had to go to a neurologist and they are not totally sure what cause his memory loss but told him to avoid anesthesia as much as possible unless it was  impossible not to do with out it.

## 2023-06-26 ENCOUNTER — Telehealth: Payer: Self-pay

## 2023-06-26 NOTE — Telephone Encounter (Signed)
Spoke with patient and he wants to proceed with colonoscopy without anesthesia.

## 2023-06-26 NOTE — Telephone Encounter (Signed)
Toney Reil, MD  Martyn Ehrich, CMA; Denman George, CMA I'm happy to do it, patient needs to understand it will be uncomfortable and sometimes I may have to abort the procedure   Informed patient of this information and he verbalized understanding

## 2023-06-27 ENCOUNTER — Encounter: Payer: Self-pay | Admitting: Gastroenterology

## 2023-06-28 ENCOUNTER — Encounter
Admission: RE | Disposition: A | Discharge status: Home / Self Care | Payer: Self-pay | Source: Home / Self Care | Attending: Gastroenterology

## 2023-06-28 ENCOUNTER — Ambulatory Visit: Payer: Managed Care, Other (non HMO) | Admitting: General Practice

## 2023-06-28 ENCOUNTER — Encounter: Payer: Self-pay | Admitting: Gastroenterology

## 2023-06-28 ENCOUNTER — Ambulatory Visit
Admission: RE | Admit: 2023-06-28 | Discharge: 2023-06-28 | Disposition: A | Discharge status: Home / Self Care | Payer: Managed Care, Other (non HMO) | Attending: Gastroenterology | Admitting: Gastroenterology

## 2023-06-28 DIAGNOSIS — Z860100 Personal history of colon polyps, unspecified: Secondary | ICD-10-CM

## 2023-06-28 DIAGNOSIS — R195 Other fecal abnormalities: Secondary | ICD-10-CM

## 2023-06-28 DIAGNOSIS — K625 Hemorrhage of anus and rectum: Secondary | ICD-10-CM

## 2023-06-28 HISTORY — PX: COLONOSCOPY WITH PROPOFOL: SHX5780

## 2023-06-28 SURGERY — COLONOSCOPY WITH PROPOFOL

## 2023-06-28 MED ORDER — SODIUM CHLORIDE 0.9 % IV SOLN: INTRAVENOUS | Status: DC

## 2023-06-28 MED ORDER — STERILE WATER FOR IRRIGATION IR SOLN
Status: DC | PRN
  Administered 2023-06-28: 120 mL

## 2023-06-28 NOTE — H&P (Signed)
Arlyss Repress, MD [address]  [address]  [place], [place] [address]  Main: [phone]  Fax: [phone] Pager: [phone]  Primary Care Physician:  Larae Grooms, NP Primary Gastroenterologist:  Dr. Arlyss Repress  Pre-Procedure History & Physical: HPI:  Antonio Mueller is a 37 y.o. male is here for an colonoscopy.   Past Medical History:  Diagnosis Date   Colon polyp    Hyperlipidemia    Lichen striatus    Lyme disease    Migraine headache     Past Surgical History:  Procedure Laterality Date   COLONOSCOPY  03/05/2018   Procedure: COLONOSCOPY;  Surgeon: Toney Reil, MD;  Location: [facility] ENDOSCOPY;  Service: Gastroenterology;;   COLONOSCOPY W/ POLYPECTOMY     FLEXIBLE SIGMOIDOSCOPY     HERNIA REPAIR     1 as a child    Prior to Admission medications   Medication Sig Start Date End Date Taking? Authorizing Provider  FIBER PO Take 5 capsules by mouth daily.   Yes [provider]  naproxen sodium (ALEVE) 220 MG tablet Take 220 mg by mouth daily as needed.   Yes [provider]  bifidobacterium infantis (ALIGN) capsule Take 1 capsule by mouth daily.    [provider]  Probiotic Product (DIGESTIVE ADVANTAGE PO) Take by mouth.    [provider]  VITAMIN D PO Take by mouth daily.    [provider]    Allergies as of 05/03/2023 - Review Complete 05/03/2023  Allergen Reaction Noted   Ethanol-alcohol [ethanol]  11/29/2022   Propofol  08/08/2018   Nickel Rash 06/05/2018    Family History  Problem Relation Age of Onset   Thyroid disease Mother    Cancer Mother        Basal Cell Carinoma   Heart murmur Mother    Hepatitis C Father    Arthritis Father        Rheumatoid   Cancer Maternal Grandmother        Breast   Cancer Maternal Grandfather        bladder   Heart disease Maternal Grandfather    Alzheimer's disease Maternal Grandfather    Alzheimer's disease Paternal Grandmother    Heart disease  Paternal Grandfather    Congestive Heart Failure Paternal Grandfather    Parkinson's disease Paternal Grandfather    Heart attack Cousin    Crohn's disease Cousin    Cancer Other        Colon Cancer   Cancer Other        Colon    Social History   Socioeconomic History   Marital status: Married    Spouse name: Not on file   Number of children: Not on file   Years of education: Not on file   Highest education level: Doctorate  Occupational History   Not on file  Tobacco Use   Smoking status: Never   Smokeless tobacco: Never  Vaping Use   Vaping status: Never Used  Substance and Sexual Activity   Alcohol use: Not Currently    Comment: 1 drink per year during holidays   Drug use: No   Sexual activity: Yes    Birth control/protection: Condom  Other Topics Concern   Not on file  Social History Narrative   Not on file   Social Determinants of Health   Financial Resource Strain: Low Risk  (03/20/2023)   Overall Financial Resource Strain (CARDIA)    Difficulty of Paying Living Expenses: Not hard  at all  Food Insecurity: No Food Insecurity (03/20/2023)   Hunger Vital Sign    Worried About Running Out of Food in the Last Year: Never true    Ran Out of Food in the Last Year: Never true  Transportation Needs: No Transportation Needs (03/20/2023)   PRAPARE - Administrator, Civil Service (Medical): No    Lack of Transportation (Non-Medical): No  Physical Activity: Insufficiently Active (03/20/2023)   Exercise Vital Sign    Days of Exercise per Week: 4 days    Minutes of Exercise per Session: 30 min  Stress: No Stress Concern Present (03/20/2023)   [employer] of Occupational Health - Occupational Stress Questionnaire    Feeling of Stress : Not at all  Social Connections: Unknown (04/06/2023)   Received from [facility]   Social Network    Social Network: Not on file  Recent Concern: Social Connections - Socially Isolated (03/20/2023)   Social Connection and  Isolation Panel [NHANES]    Frequency of Communication with Friends and Family: Never    Frequency of Social Gatherings with Friends and Family: Never    Attends Religious Services: Never    Database administrator or Organizations: No    Attends Engineer, structural: Not on file    Marital Status: Married  Intimate Partner Violence: Unknown (04/06/2023)   Received from [facility]   HITS    Physically Hurt: Not on file    Insult or Talk Down To: Not on file    Threaten Physical Harm: Not on file    Scream or Curse: Not on file    Review of Systems: See HPI, otherwise negative ROS  Physical Exam: BP (!) 141/87   Pulse (!) 105   Temp (!) 96.7 F (35.9 C) (Temporal)   Resp 20   Wt 117.5 kg   SpO2 99%   BMI 37.16 kg/m  General:   Alert,  pleasant and cooperative in NAD Head:  Normocephalic and atraumatic. Neck:  Supple; no masses or thyromegaly. Lungs:  Clear throughout to auscultation.    Heart:  Regular rate and rhythm. Abdomen:  Soft, nontender and nondistended. Normal bowel sounds, without guarding, and without rebound.   Neurologic:  Alert and  oriented x4;  grossly normal neurologically.  Impression/Plan: Antonio Mueller is here for an colonoscopy to be performed for rectal bleeding  Risks, benefits, limitations, and alternatives regarding  colonoscopy have been reviewed with the patient.  Questions have been answered.  All parties agreeable.   Lannette Donath, MD  06/28/2023, 10:15 AM

## 2023-06-28 NOTE — Op Note (Addendum)
[facility] Patient Name: Antonio Mueller Procedure Date: 06/28/2023 10:41 AM MRN: [id number] Account #: [id number] Date of Birth: 11-08-1986 Admit Type: Outpatient Age: 37 Room: [facility] ENDO ROOM 4 Gender: Male Note Status: Finalized Instrument Name: Peds Colonoscope [id number] Procedure:             Colonoscopy Indications:           Last colonoscopy: April 2019, Rectal bleeding Providers:             Toney Reil MD, MD Medicines:             General Anesthesia Complications:         No immediate complications. Estimated blood loss: None. Procedure:             Pre-Anesthesia Assessment:                        - Prior to the procedure, a History and Physical was                         performed, and patient medications and allergies were                         reviewed. The patient is competent. The risks and                         benefits of the procedure and the sedation options and                         risks were discussed with the patient. All questions                         were answered and informed consent was obtained.                         Patient identification and proposed procedure were                         verified by the physician, the nurse, the                         anesthesiologist, the anesthetist and the technician                         in the pre-procedure area in the procedure room in the                         endoscopy suite. Mental Status Examination: alert and                         oriented. Airway Examination: normal oropharyngeal                         airway and neck mobility. Respiratory Examination:                         clear to auscultation. CV Examination: normal.                         Prophylactic  Antibiotics: The patient does not require                         prophylactic antibiotics. Prior Anticoagulants: The                         patient has taken no anticoagulant or antiplatelet                          agents. ASA Grade Assessment: I - A normal, healthy                         patient. After reviewing the risks and benefits, the                         patient was deemed in satisfactory condition to                         undergo the procedure. The anesthesia plan was to use                         no sedation or anesthesia. Immediately prior to                         administration of medications, the patient was                         re-assessed for adequacy to receive sedatives. The                         heart rate, respiratory rate, oxygen saturations,                         blood pressure, adequacy of pulmonary ventilation, and                         response to care were monitored throughout the                         procedure. The physical status of the patient was                         re-assessed after the procedure.                        After obtaining informed consent, the colonoscope was                         passed under direct vision. Throughout the procedure,                         the patient's blood pressure, pulse, and oxygen                         saturations were monitored continuously. The                         Colonoscope was introduced through the anus and  advanced to the the cecum, identified by appendiceal                         orifice and ileocecal valve. The colonoscopy was                         performed without difficulty. The patient tolerated                         the procedure well. The quality of the bowel                         preparation was good. The ileocecal valve, appendiceal                         orifice, and rectum were photographed. Findings:      The perianal and digital rectal examinations were normal. Pertinent       negatives include normal sphincter tone and no palpable rectal lesions.      The entire examined colon appeared normal.      The retroflexed view of  the distal rectum and anal verge was normal and       showed no anal or rectal abnormalities.      A tattoo was seen in the sigmoid colon. A post-polypectomy scar was       found at the tattoo site. There was no evidence of residual polyp tissue. Impression:            - The entire examined colon is normal.                        - The distal rectum and anal verge are normal on                         retroflexion view.                        - A tattoo was seen in the sigmoid colon. A                         post-polypectomy scar was found at the tattoo site.                         There was no evidence of residual polyp tissue.                        - No specimens collected. Recommendation:        - Discharge patient to home (ambulatory).                        - Resume previous diet today.                        - Continue present medications. Procedure Code(s):     --- Professional ---                        [phone], Colonoscopy, flexible; diagnostic, including  collection of specimen(s) by brushing or washing, when                         performed (separate procedure) Diagnosis Code(s):     --- Professional ---                        K62.5, Hemorrhage of anus and rectum CPT copyright 2022 [employer]. All rights reserved. The codes documented in this report are preliminary and upon coder review may  be revised to meet current compliance requirements. Dr. Libby Maw Toney Reil MD, MD 06/28/2023 11:18:11 AM This report has been signed electronically. Number of Addenda: 0 Note Initiated On: 06/28/2023 10:41 AM Scope Withdrawal Time: 0 hours 8 minutes 49 seconds  Total Procedure Duration: 0 hours 15 minutes 16 seconds  Estimated Blood Loss:  Estimated blood loss: none. Estimated blood loss:                         none. Estimated blood loss: none.      [facility]

## 2023-06-29 ENCOUNTER — Encounter: Payer: Self-pay | Admitting: Gastroenterology

## 2024-07-26 ENCOUNTER — Encounter (HOSPITAL_BASED_OUTPATIENT_CLINIC_OR_DEPARTMENT_OTHER): Payer: Self-pay

## 2024-07-29 ENCOUNTER — Other Ambulatory Visit: Payer: Self-pay

## 2024-07-29 ENCOUNTER — Encounter (HOSPITAL_BASED_OUTPATIENT_CLINIC_OR_DEPARTMENT_OTHER): Payer: Self-pay | Admitting: Medical

## 2024-07-29 ENCOUNTER — Ambulatory Visit: Payer: MEDICAID | Attending: Medical | Admitting: Medical

## 2024-07-29 VITALS — BP 142/82 | HR 93 | Temp 97.5°F | Ht 69.33 in | Wt 262.3 lb

## 2024-07-29 DIAGNOSIS — Z0000 Encounter for general adult medical examination without abnormal findings: Secondary | ICD-10-CM | POA: Insufficient documentation

## 2024-07-29 DIAGNOSIS — R102 Pelvic and perineal pain: Secondary | ICD-10-CM | POA: Insufficient documentation

## 2024-07-29 DIAGNOSIS — R3914 Feeling of incomplete bladder emptying: Secondary | ICD-10-CM | POA: Insufficient documentation

## 2024-07-29 DIAGNOSIS — M7500 Adhesive capsulitis of unspecified shoulder: Secondary | ICD-10-CM | POA: Insufficient documentation

## 2024-07-29 DIAGNOSIS — M2550 Pain in unspecified joint: Secondary | ICD-10-CM | POA: Insufficient documentation

## 2024-07-29 DIAGNOSIS — R350 Frequency of micturition: Secondary | ICD-10-CM | POA: Insufficient documentation

## 2024-07-29 DIAGNOSIS — L853 Xerosis cutis: Secondary | ICD-10-CM

## 2024-07-29 DIAGNOSIS — K589 Irritable bowel syndrome without diarrhea: Secondary | ICD-10-CM | POA: Insufficient documentation

## 2024-07-29 DIAGNOSIS — N400 Enlarged prostate without lower urinary tract symptoms: Secondary | ICD-10-CM | POA: Insufficient documentation

## 2024-07-29 DIAGNOSIS — E063 Autoimmune thyroiditis: Secondary | ICD-10-CM | POA: Insufficient documentation

## 2024-07-29 DIAGNOSIS — G8929 Other chronic pain: Secondary | ICD-10-CM | POA: Insufficient documentation

## 2024-07-29 NOTE — Progress Notes (Signed)
 Department of Family Medicine   History and Physical      Cody Conrad  MRN: [id number]  DOB: 30-Oct-1986  Date of Service: 08/06/2024    CHIEF COMPLAINT  Chief Complaint   Patient presents with    Establish Care       HISTORY OF PRESENT ILLNESS  Cody Conrad is a 38 y.o. male presenting to clinic to establish care.     He has a PMH of migraine, IBS, adhesive capsulitis of the right shoulder, pericarditis, lyme disease, and hashimoto's thyroiditis.    He stated he has been seen by multiple specialists down in [place]  over the last 5-6 year. He stated he was diagnosed with Lyme disease and treated appropriately. A year or so later, he was diagnosed with pericarditis and treated with Colchicine for 3 months. He continues to have wide spread joint pains and stiffness. He stated he was seen by a rheumatologist and all of his autoimmune labs were unremarkable. He stated he had thyroid  labs done by his PCP that showed normal TSH and T4 but he had TPO antibodies. He was told he needed to have his thyroid  labs monitored.     He also stated he has chronic urinary frequency and pelvic pain that occurs after ejaculation. He stated he was seeing a urologist that stated his prostate was significantly enlarged for his age. He has never had a PSA. He has tried multiple different medications for BPH that did not help. He had a cystoscopy and bladder US  that were both unremarkable. He stated he was told he has chronic pelvic pain. He continues to have urinary frequency. He also admitted to feeling like he does not empty his bladder completely. He has intermittent burning with urination.     He was diagnosed with adhesive capsulitis of the right shoulder. He stated he did PT and had glucocorticoid injections. He continues to have decreased range of motion. He is not interested in seeing anyone for this at this time.     For IBS, he stated his symptoms have improved with dietary modifications.       PAST MEDICAL HISTORY  Past  Medical History:   Diagnosis Date    Adhesive capsulitis     R shoulder    IBS (irritable bowel syndrome)     Lyme disease     Migraines     Pericarditis, acute, idiopathic            MEDICATIONS  Bifidobacterium infantis (ALIGN) 10.5 mg (10 million cell) Oral Tablet, Chewable, Take by mouth Once a day  L.acidophil,paracas,B.animalis (DIGESTIVE ADVANTAGE ADVANCED ORAL), Take by mouth  psyllium (METAMUCIL) Packet, Take 1 Packet by mouth Twice daily    No facility-administered medications prior to visit.      ALLERGIES  Allergies[1]    PAST SURGICAL HISTORY  Past Surgical History:   Procedure Laterality Date    COLONOSCOPY      CYSTOSCOPY      HX HERNIA REPAIR      HX WISDOM TEETH EXTRACTION      SIGMOIDOSCOPY FLEXIBLE             IMMUNIZATIONS  Immunization History   Administered Date(s) Administered    Influenza Vaccine, 6 month-adult 10/14/2016    Tetanus Toxoid/Diphtheria Toxoid/Acellular Pertussis Vaccine, Adsorbed 06/24/2011       FAMILY HISTORY  Family Medical History:       Problem Relation (Age of Onset)    Alzheimer's/Dementia Paternal Grandmother    Breast Cancer  Maternal Grandmother    Colon Cancer Multiple family members    Congestive Heart Failure Paternal Grandfather    Crohn's Disease Other    Hepatitis C Father    Parkinsons Disease Paternal Grandfather    Thyroid  Disease Mother              SOCIAL HISTORY  Social History     Socioeconomic History    Marital status: Single   Tobacco Use    Smoking status: Never    Smokeless tobacco: Never   Substance and Sexual Activity    Alcohol use: Not Currently     Comment: drink per week    Drug use: No   Other Topics Concern    Ability to Walk 1 Flight of Steps without SOB/CP Yes    Routine Exercise No    Ability to Walk 2 Flight of Steps without SOB/CP Yes    Unable to Ambulate No    Total Care No    Ability To Do Own ADL's Yes    Uses Walker No    Uses Cane No       REVIEW OF SYSTEMS  Positive ROS discussed in HPI, otherwise all other systems  negative.      PHYSICAL EXAM  Vitals: Blood pressure (!) 142/82, pulse 93, temperature 36.4 C (97.5 F), temperature source Thermal Scan, height 1.761 m (5' 9.33), weight 119 kg (262 lb 5.6 oz), SpO2 98%. Body mass index is 38.37 kg/m.  General: appears stated age, no acute distress  HEENT: conjunctiva clear; pupils equal and round; normal appearing TMs bilaterally; mouth mucus membranes moist; pharynx appears normal without exudate or erythema  Neck: no thyromegaly or lymphadenopathy  Cardiovascular: RRR, no murmur  Lungs: clear to auscultation bilaterally  Abdomen: soft, non-tender, bowel sounds normal  Extremities: no cyanosis or edema  Skin: no rashes or lesions  Neurologic: CN 2-12 grossly intact, AOx3  Psychiatric: normal affect and behavior    ASSESSMENT AND PLAN    1. Encounter for medical examination to establish care (Primary)  BP borderline. Start lifestyle modifications  BMI above goal. Start lifestyle modifications  Lipids, A1c, BMP ordered  Hepatitis C and HIV screenings ordered  He believes he is UTD on Tdap    2. Arthralgia, unspecified joint  Chronic and uncontrolled  He stated he had autoimmune labs done that were negative  Will request records     3. Hashimoto's thyroiditis  Chronic and stable  He reported having normal TSH and T4 labs with elevated TPO antibodies  Will repeat thyroid  testing and TPO antibodies     4. Irritable bowel syndrome, unspecified type  Chronic and stable   Continue dietary modifications    5. Adhesive capsulitis of shoulder, unspecified laterality  Chronic and stable  He had PT and injections in the R shoulder without much benefit. He is not interested in seeing anyone or doing PT at this time     6. Benign prostatic hyperplasia, unspecified whether lower urinary tract symptoms present  7. Chronic pelvic pain in male  8. Feeling of incomplete bladder emptying  9. Urinary frequency  Chronic and uncontrolled  With hx of enlarged prostate and LUTS, will order a PSA  He  has tried multiple medications without improvement  Will refer to urology for evaluation       Orders Placed This Encounter    THYROID  STIMULATING HORMONE WITH FREE T4 REFLEX    THYROPEROXIDASE (TPO) ANTIBODIES, SERUM    LIPID PANEL  HGA1C (HEMOGLOBIN A1C WITH EST AVG GLUCOSE)    BASIC METABOLIC PANEL    PSA SCREENING    HEPATITIS C ANTIBODY SCREEN WITH REFLEX TO HCV PCR    HIV1/HIV2 SCREEN, COMBINED ANTIGEN AND ANTIBODY    Refer to [facility]     Follow up in 3 months or sooner if needed       Lonni Beal, PA-C 08/06/2024, 20:18         [1]   Allergies  Allergen Reactions    Nkda [No Known Drug Allergies]

## 2024-07-30 ENCOUNTER — Ambulatory Visit: Payer: MEDICAID | Attending: Medical

## 2024-07-30 ENCOUNTER — Other Ambulatory Visit: Payer: Self-pay

## 2024-07-30 DIAGNOSIS — R350 Frequency of micturition: Secondary | ICD-10-CM | POA: Insufficient documentation

## 2024-07-30 DIAGNOSIS — Z0000 Encounter for general adult medical examination without abnormal findings: Secondary | ICD-10-CM | POA: Insufficient documentation

## 2024-07-30 DIAGNOSIS — E063 Autoimmune thyroiditis: Secondary | ICD-10-CM | POA: Insufficient documentation

## 2024-07-30 DIAGNOSIS — R3914 Feeling of incomplete bladder emptying: Secondary | ICD-10-CM | POA: Insufficient documentation

## 2024-07-30 LAB — BASIC METABOLIC PANEL
ANION GAP: 12 mmol/L (ref 4–13)
BUN/CREA RATIO: 10 (ref 6–22)
BUN: 11 mg/dL (ref 8–25)
CALCIUM: 9.4 mg/dL (ref 8.6–10.2)
CHLORIDE: 103 mmol/L (ref 96–111)
CO2 TOTAL: 24 mmol/L (ref 22–30)
CREATININE: 1.1 mg/dL (ref 0.75–1.35)
ESTIMATED GFR - MALE: 88 mL/min/BSA (ref >=60)
GLUCOSE: 85 mg/dL (ref 65–125)
POTASSIUM: 4.2 mmol/L (ref 3.5–5.1)
SODIUM: 139 mmol/L (ref 136–145)

## 2024-07-30 LAB — LIPID PANEL
CHOL/HDL RATIO: 4.4
CHOLESTEROL: 182 mg/dL (ref 100–200)
HDL CHOL: 41 mg/dL — ABNORMAL LOW (ref >=50)
LDL CALC: 100 mg/dL — ABNORMAL HIGH (ref <100)
NON-HDL: 141 mg/dL (ref <=190)
TRIGLYCERIDES: 238 mg/dL — ABNORMAL HIGH (ref <150)
VLDL CALC: 39 mg/dL — ABNORMAL HIGH (ref <30)

## 2024-07-30 LAB — HEPATITIS C ANTIBODY SCREEN WITH REFLEX TO HCV PCR: HCV ANTIBODY QUALITATIVE: NEGATIVE

## 2024-07-30 LAB — HIV1/HIV2 SCREEN, COMBINED ANTIGEN AND ANTIBODY: HIV SCREEN, COMBINED ANTIGEN & ANTIBODY: NEGATIVE

## 2024-07-30 LAB — THYROID STIMULATING HORMONE WITH FREE T4 REFLEX: TSH: 3.902 u[IU]/mL (ref 0.350–4.940)

## 2024-07-30 LAB — HGA1C (HEMOGLOBIN A1C WITH EST AVG GLUCOSE)
ESTIMATED AVERAGE GLUCOSE: 91 mg/dL
HEMOGLOBIN A1C: 4.8 % (ref 4.0–5.6)

## 2024-07-30 LAB — PSA SCREENING: PSA: 0.81 ng/mL (ref <=4.00)

## 2024-07-31 LAB — THYROPEROXIDASE (TPO) ANTIBODIES, SERUM: ANTI THYROPEROXIDASE ANTIBODIES: 131 [IU]/mL — ABNORMAL HIGH (ref <=14)

## 2024-08-09 ENCOUNTER — Encounter (HOSPITAL_BASED_OUTPATIENT_CLINIC_OR_DEPARTMENT_OTHER): Payer: Self-pay | Admitting: Medical

## 2024-08-09 ENCOUNTER — Other Ambulatory Visit (HOSPITAL_BASED_OUTPATIENT_CLINIC_OR_DEPARTMENT_OTHER): Payer: Self-pay | Admitting: Medical

## 2024-08-09 DIAGNOSIS — R768 Other specified abnormal immunological findings in serum: Secondary | ICD-10-CM

## 2024-08-23 ENCOUNTER — Other Ambulatory Visit: Payer: Self-pay

## 2024-08-23 ENCOUNTER — Ambulatory Visit: Payer: MEDICAID | Attending: UROLOGY | Admitting: UROLOGY

## 2024-08-23 ENCOUNTER — Encounter (INDEPENDENT_AMBULATORY_CARE_PROVIDER_SITE_OTHER): Payer: Self-pay | Admitting: UROLOGY

## 2024-08-23 VITALS — BP 143/95 | HR 88 | Temp 97.2°F | Ht 70.0 in | Wt 264.6 lb

## 2024-08-23 DIAGNOSIS — M7918 Myalgia, other site: Secondary | ICD-10-CM

## 2024-08-23 DIAGNOSIS — N400 Enlarged prostate without lower urinary tract symptoms: Secondary | ICD-10-CM

## 2024-08-23 DIAGNOSIS — N4889 Other specified disorders of penis: Secondary | ICD-10-CM

## 2024-08-23 DIAGNOSIS — R300 Dysuria: Secondary | ICD-10-CM | POA: Insufficient documentation

## 2024-08-23 DIAGNOSIS — G8929 Other chronic pain: Secondary | ICD-10-CM | POA: Insufficient documentation

## 2024-08-23 DIAGNOSIS — M6289 Other specified disorders of muscle: Secondary | ICD-10-CM | POA: Insufficient documentation

## 2024-08-23 DIAGNOSIS — R102 Pelvic and perineal pain: Secondary | ICD-10-CM

## 2024-08-23 DIAGNOSIS — R3914 Feeling of incomplete bladder emptying: Secondary | ICD-10-CM | POA: Insufficient documentation

## 2024-08-23 DIAGNOSIS — R350 Frequency of micturition: Secondary | ICD-10-CM

## 2024-08-23 DIAGNOSIS — R3915 Urgency of urination: Secondary | ICD-10-CM | POA: Insufficient documentation

## 2024-08-23 NOTE — H&P (Signed)
 [place]  [facility]   DIVISION OF UROLOGY   NEW PATIENT HISTORY AND PHYSICAL      PATIENT NAME: Cody Conrad  HOSPITAL NUMBER: [id number]   DATE OF SERVICE: 08/23/2024   DATE OF BIRTH: 09-Oct-1986    CC:   Chief Complaint   Patient presents with    Other     BPH         HPI: Cody Conrad is a 38 y.o. male with a h/o chronic/recurrent penile associated w/ irritative voiding symptoms & intermittent dysuria, who presents to [facility] today 08/23/2024 for evaluation of this issue.     Patient has a 4-5 year history of this recurrent pain/burning sensation within the mid-distal penile shaft that is associated w/ great urinary urgency/frequency/dysuria/ feeling of incomplete emptying when in an acute flare (repeat voids will be low volume), rarely he will also feel some perineal pain. Incidence & duration of flares vary widely (can last a week +, sometimes as short as 1 day, last incidence was 1 week long about a month ago). He states that initially the pain would occur only 5 mins after ejaculation, but now for the last 2 years the pain & urgency/dysuria occurs seemingly at random & still sometimes after ejaculation, he has not found activity that makes it better or worse aside from Kegel exercises which greatly worsen the pain. He will occasionally experience the sensation of pressure w/ erections & it feels too hard which he associates w/ an increased likelihood of experiencing the post-ejaculatory penile pain. He has been followed by Urology in [place]  in the past, he has tried several different alpha blockers & amitriptyline w/ no benefit, he has been repeatedly tested for UTI/STI & has never had a positive result, cystoscopy was unremarkable aside from mildly enlarge prostate (no urodynamic testing done), they discontinued his car 1.5 years ago stating they could no longer help him. He has never had a concern for urinary retention, flank pain or kidney stones, spraying/splitting of urine  stream, denies bladder pain or sensation of bladder spasm, denies gross hematuria. States that increased water intake can help lessen the pain but does not seem to affect the incidence of flares. He denies constipation (has hx of diarrheal IBS, history of large 30 mm rectal polyp w/o recurrence last year- removal did not affect his pain). Acute flares affect his sexual health but he denies significant erectile dysfunction in general (just difficulty getting erection when he's actively in pain), typically erections are non-painful, he denies penile curvature, penile or pelvic trauma, has never had a pelvic surgery/vasectomy. Denies scrotal pain. No history of inguinal hernias (congenital umbilical/ventral hernias w/ repairs).     AUA SS: 14 (moderate)  QOL: 5 (unhappy)  PVR: 135 mL (urinated prior to appointment)    PMHx:   Past Medical History:   Diagnosis Date    Adhesive capsulitis     R shoulder    IBS (irritable bowel syndrome)     Lyme disease     Migraines     Pericarditis, acute, idiopathic      PSHx:   Past Surgical History:   Procedure Laterality Date    COLONOSCOPY      CYSTOSCOPY      HX HERNIA REPAIR      HX WISDOM TEETH EXTRACTION      SIGMOIDOSCOPY FLEXIBLE       Family Hx: SABRA  Family Medical History:       Problem Relation (Age of Onset)  Alzheimer's/Dementia Paternal Grandmother    Breast Cancer Maternal Grandmother    Colon Cancer Multiple family members    Congestive Heart Failure Paternal Grandfather    Crohn's Disease Other    Hepatitis C Father    Parkinsons Disease Paternal Grandfather    Thyroid  Disease Mother          Social Hx:   Social History     Socioeconomic History    Marital status: Single     Spouse name: Not on file    Number of children: Not on file    Years of education: Not on file    Highest education level: Not on file   Occupational History    Not on file   Tobacco Use    Smoking status: Never    Smokeless tobacco: Never   Substance and Sexual Activity    Alcohol use: Not  Currently     Comment: drink per week    Drug use: No    Sexual activity: Not on file   Other Topics Concern    Ability to Walk 1 Flight of Steps without SOB/CP Yes    Routine Exercise No    Ability to Walk 2 Flight of Steps without SOB/CP Yes    Unable to Ambulate No    Total Care No    Ability To Do Own ADL's Yes    Uses Walker No    Other Activity Level Not Asked    Uses Cane No   Social History Narrative    Not on file     Social Determinants of Health     Financial Resource Strain: Low Risk  (03/20/2023)    Received from [facility]    Overall Financial Resource Strain (CARDIA)     Difficulty of Paying Living Expenses: Not hard at all   Transportation Needs: No Transportation Needs (03/20/2023)    Received from [facility] - [facility]     Lack of Transportation (Medical): No     Lack of Transportation (Non-Medical): No   Social Connections: Socially Isolated (03/20/2023)    Received from [facility]    Social Connection and Isolation Panel     In a typical week, how many times do you talk on the phone with family, friends, or neighbors?: Never     How often do you get together with friends or relatives?: Never     How often do you attend church or religious services?: Never     Do you belong to any clubs or organizations such as church groups, unions, fraternal or athletic groups, or school groups?: No     Attends Engineer, structural: Not on file     Are you married, widowed, divorced, separated, never married, or living with a partner?: Married   Intimate Partner Violence: Not on file   Housing Stability: Not on file     Medications:   Current Outpatient Medications   Medication Sig    Bifidobacterium infantis (ALIGN) 10.5 mg (10 million cell) Oral Tablet, Chewable Take by mouth Once a day    L.acidophil,paracas,B.animalis (DIGESTIVE ADVANTAGE ADVANCED ORAL) Take by mouth    psyllium (METAMUCIL) Packet Take 1 Packet by mouth Twice daily     Allergies: Allergies[1]    ROS:  Pertinent  positives listed in HPI.    Physical Exam:  BP (!) 143/95   Pulse 88   Temp 36.2 C (97.2 F)   Ht 1.778 m (5' 10)  Wt 120 kg (264 lb 8.8 oz)   SpO2 99%   BMI 37.96 kg/m     General - 38 y.o. male, alert/oriented, NAD.  GU system - No CVAT bilaterally. No suprapubic fullness or tenderness. Mild suprapubic fat pad appreciated. Phallus is circumcised. The shaft is free of plaques, masses, abrasions, edema, erythema, or noticeable curvature. Meatus is patent & slit-like w/ no appreciable discharge. Testes are bilaterally descended, normal lie, smooth & free of lesions or masses. Mild epididymal fullness appreciated bilaterally. There is no significant evidence of varicoceles on sitting/standing exam w/wo valsalva, hydroceles, cord structures palpable bilaterally. There are no hernias appreciated. DRE reveals significant increased pelvic floor muscle & anal sphincter tone.  Psych - Normal affect.     Urinalysis: 08/23/2024- could not provide sample.    CBC  Lab Results   Component Value Date/Time    WBC 8.3 11/03/2016 04:16 PM    HGB 16.6 (H) 11/03/2016 04:16 PM    HCT 47.2 (H) 11/03/2016 04:16 PM    PLTCNT 251 11/03/2016 04:16 PM    RBC 5.57 11/03/2016 04:16 PM    MCV 84.8 11/03/2016 04:16 PM    MCHC 35.2 11/03/2016 04:16 PM    MCH 29.9 11/03/2016 04:16 PM    RDW 13.2 11/03/2016 04:16 PM    MPV 8.7 11/03/2016 04:16 PM       CMP  Lab Results   Component Value Date    SODIUM 139 07/30/2024    POTASSIUM 4.2 07/30/2024    CHLORIDE 103 07/30/2024    CO2 24 07/30/2024    ANIONGAP 12 07/30/2024    BUN 11 07/30/2024    CREATININE 1.10 07/30/2024    GLUCOSENF 96 11/03/2016    GLUCOSE 85 07/30/2024    CALCIUM 9.4 07/30/2024    ALBUMIN 4.1 11/03/2016    TOTALPROTEIN 7.6 11/03/2016    ALKPHOS 62 11/03/2016    AST 20 11/03/2016    ALT 24 11/03/2016    GFR 88 07/30/2024     CREATININE   Date Value Ref Range Status   07/30/2024 1.10 0.75 - 1.35 mg/dL Final   88/69/7982 8.95 0.62 - 1.27 mg/dL Final     ESTIMATED GFR   Date  Value Ref Range Status   11/03/2016 >59 >59 mL/min/1.70m^2 Final     eGFRcr - MALE   Date Value Ref Range Status   07/30/2024 88 >=60 mL/min/BSA Final     Comment:     Estimated Glomerular Filtration Rate (eGFR) is calculated using the gender-dependent CKD-EPI (2021) equation, intended for patients 61 years of age and older.    If patient sex is not documented, or if patient sex and gender are incongruent, eGFR results calculated for both male and male will be reported. Recommend correlation and careful interpretation of patient history, keeping in mind that serum creatinine is influenced by hormone therapy.    Stage, GFR, Classification   G1, 90, Normal or High   G2, 60-89, Mildly decreased   G3a, 45-59, Mildly to moderately decreased   G3b, 30-44, Moderately to severely decreased   G4, 15-29, Severely decreased   G5, <15, Kidney failure   In the absence of kidney damage, neither G1 or G2 fulfill criteria for CKD per KDIGO.       HEMOGLOBIN A1C   Date Value Ref Range Status   07/30/2024 4.8 4.0 - 5.6 % Final     PSA   Date Value Ref Range Status   07/30/2024 0.81 <=4.00 ng/mL Final  Comment:     Total PSA measurement performed with Abbott immunoassay.Patient results determined by assays using different manufacturers or methods may not be comparable.    No clinical decision points have been set for males <26 years of age. For adult males, total PSA results >4.0 ng/mL and <10.1 ng/mL should be tested for free PSA, and the free PSA ratio calculated for risk assessment. Free PSA testing is NOT indicated when total PSA is <4.0 ng/mL, or >10.0 ng/mL. Benign prostatic hyperplasia can yield total PSA results in the grey zone, but typicallly not results >10.0 ng/mL. Artifactual, mild PSA elevations can occur if sexual activity or digital rectal examination are performed less than 2 days before specimen collection.  PSA Methodology- Chemiluminescent Immunoassay performed on Abbott Alinity.       PROSTATE SPECIFIC  ANTIGEN   Lab Results   Component Value Date/Time    PROSSPECAG 0.81 07/30/2024 07:30 AM          Imaging/data reviewed:  - 07/29/24 PCP encounter note.  - No imaging relevant to CC.   All relevant details summarized in HPI.    Assessment: 38 y.o. 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  - Referral to pelvic floor physical therapy ([facility], external referral provided).   - He will return in 6 months for symptom reassessment.    After thorough counseling on the nature of chronic pelvic pain secondary to pelvic floor dysfunction, patient states he will highly consider pelvic floor therapy, is hesitant d/t PT for other joint pain not working in the past. He states he will try the TENS unit, and is appreciative for the education & reassurance that this condition is nothing life-threatening.      Orders Placed This Encounter    ULTRASOUND MEASURING PVR (AMB ONLY) [[id number]]    Refer to External Provider       Return in about 6 months (around 02/20/2025) for follow up of chronic pelvic pain syndrome after TENS unit & pelvic floor physical therapy.      Orren Hand, PA-C  [place]  [facility]  Department of Reconstructive Urology   & Men's Health  08/23/2024, 17:19     I personally saw and evaluated the patient as part of a shared service with an APP.    My substantive findings are: chronic pelvic pain syndrome in male. Recommended PFPT to patient who declined at this time. Discussed TENS unit which he will try. We can also consider additional therapies such as quercetin in the future pending his results.     Arthea Kiel, MD           [1]    Allergies  Allergen Reactions    [facility] Known Drug Allergies]

## 2024-08-29 ENCOUNTER — Encounter (HOSPITAL_BASED_OUTPATIENT_CLINIC_OR_DEPARTMENT_OTHER): Payer: Self-pay

## 2024-08-29 ENCOUNTER — Other Ambulatory Visit: Payer: Self-pay

## 2024-08-29 ENCOUNTER — Ambulatory Visit: Payer: MEDICAID

## 2024-08-29 ENCOUNTER — Ambulatory Visit (HOSPITAL_BASED_OUTPATIENT_CLINIC_OR_DEPARTMENT_OTHER): Payer: MEDICAID

## 2024-08-29 ENCOUNTER — Ambulatory Visit (HOSPITAL_BASED_OUTPATIENT_CLINIC_OR_DEPARTMENT_OTHER): Payer: Self-pay

## 2024-08-29 VITALS — BP 138/92 | HR 72 | Ht 70.0 in | Wt 267.2 lb

## 2024-08-29 DIAGNOSIS — E063 Autoimmune thyroiditis: Secondary | ICD-10-CM

## 2024-08-29 DIAGNOSIS — R768 Other specified abnormal immunological findings in serum: Secondary | ICD-10-CM

## 2024-08-29 LAB — THYROID STIMULATING HORMONE (SENSITIVE TSH): TSH: 2.823 u[IU]/mL (ref 0.350–4.940)

## 2024-08-29 LAB — THYROXINE, FREE (FREE T4): THYROXINE (T4), FREE: 1.05 ng/dL (ref 0.70–1.48)

## 2024-08-29 NOTE — H&P (Signed)
 Department of Endocrinology  Clinic visit      Name/MRN: Cody Conrad, [id number] Date of service: 08/29/2024   Age/DOB: 38 y.o., [birth year] Chief Complaint: New Patient (Anti TPO)     HISTORY OF PRESENTING ILLNESS:     Cody Conrad is a 38 y.o. male with PMH of IBS who presents to Endocrinology Clinic as a referral  for positive TPO.      Today, he reported muscle and joint pain, feeling tired, dry skin, feeling hot most of times. Stated that his complaints are for years now about 2 years. Has been evaluated by different specialities but couldnot find a diagnosis. Had work up for thyroid  hormone and TPO antibodies lately.     Has sometimes hoarseness of voice, evaluated by ENT and couldnot find anything. Tried PPI but did not help. His mother had the similar condition in her 37's, and she was diagnosed with Hashimoto's thyroiditis, and has thyroid  US  with extensive damaged thyroid  gland and thyroid  nodules which were found to be benign, she is on levothyroxine     Reported chest muscle tightness, had negative cardiology work up    He is trying to control multiple symptoms with tylenol        MEDICAL HISTORY:   PAST MEDICAL & SURGICAL HISTORIES:   Past Medical History:   Diagnosis Date    Adhesive capsulitis     R shoulder    IBS (irritable bowel syndrome)     Lyme disease     Migraines     Pericarditis, acute, idiopathic         Past Surgical History:   Procedure Laterality Date    COLONOSCOPY      CYSTOSCOPY      HX HERNIA REPAIR      HX WISDOM TEETH EXTRACTION      SIGMOIDOSCOPY FLEXIBLE              HOME MEDICATIONS:  Current Outpatient Medications   Medication Sig    Bifidobacterium infantis (ALIGN) 10.5 mg (10 million cell) Oral Tablet, Chewable Take by mouth Once a day    L.acidophil,paracas,B.animalis (DIGESTIVE ADVANTAGE ADVANCED ORAL) Take by mouth    psyllium (METAMUCIL) Packet Take 1 Packet by mouth Twice daily         ALLERGIES:  Allergies[1]     FAMILY HISTORY:  Family Medical History:       Problem  Relation (Age of Onset)    Alzheimer's/Dementia Paternal Grandmother    Breast Cancer Maternal Grandmother    Colon Cancer Multiple family members    Congestive Heart Failure Paternal Grandfather    Crohn's Disease Other    Hepatitis C Father    Parkinsons Disease Paternal Grandfather    Thyroid  Disease Mother              SOCIAL HISTORY:  Social History[2]      REVIEW OF SYSTEMS:     ROS: All systems reviewed and negative except for as above.    EXAMINATION:   Vitals:   BP (!) 138/92 (Patient Position: Sitting)   Pulse 72   Ht 1.778 m (5' 10)   Wt 121 kg (267 lb 3.2 oz)   SpO2 99%   BMI 38.34 kg/m       Physical Exam:  General: Well appearing male in no acute distress.  Psychiatric: Normal mood and affect  Neuro: Alert and oriented x 3. Speech fluent. Cranial nerves II-XII intact. No focal deficits noted.  HEENT: Head normocephalic, atraumatic. EOMI, conjunctiva  clear. Oral mucosa pink and moist.   Thyroid /Neck: Trachea midline  Lungs: Normal respiratory effort.   Cardiac: Regular rate and rhythm  Abdomen: Non-distended  Extremities: No edema noted bilaterally  Skin: No visible rashes.            DATA REVIEWED:   I have reviewed previous labs, tests, imaging, and notes.    CT neck 04/2021: Thyroid : Negative       ASSESSMENT & PLAN:     Orders Placed This Encounter    THYROXINE, FREE (FREE T4)    THYROID  STIMULATING HORMONE (SENSITIVE TSH)       Cody Conrad is a 38 y.o. male with PMH of IBS who presents to Endocrinology Clinic as a referral  for positive TPO.    #Hashimoto Thyroiditis:  -clinically, multiple non specific symptoms. Previous negative evaluation by different specialties  -labs  07/2024  TSH 3.902  Tpo 131   -CT neck with normal thyroid  2022  -Mother with Hashimoto's thyroiditis  -denied anxiety        Discussed the etiology of Hashimoto's thyroiditis.Explained the risk of developing hypothyroidism in future with positive TPO antibodies yet TSH has been normal in his labs and symptoms are not  specific. We don't recommend levothyroxine treatment at time being to avoid iatrogenic thyrotoxicosis     Will obtain TSH and FT4 today. Recommended follow up with PCP with TSH checking every 6-12 months     PLAN:  1-Check TSH and FT4       On the day of the encounter, a total of  23 minutes was spent on this patient encounter including  review of historical information, examination, documentation and post-visit activities. The time documented excludes procedural time. Questions were answered. Patient verbalized understanding and agreement to our plan.     Elouise Sheen, MD (she,her)  Assistant Professor  Endocrinology, Diabetes and Metabolism  [facility]                     [1]   Allergies  Allergen Reactions    Nkda [No Known Drug Allergies]    [2]   Social History  Tobacco Use    Smoking status: Never    Smokeless tobacco: Never   Substance Use Topics    Alcohol use: Not Currently     Comment: drink per week    Drug use: No

## 2024-08-29 NOTE — Result Encounter Note (Signed)
 Hi Mr.Cody Conrad     Your thyroid  function tests are normal and reassuring. I don't recommend thyroid  hormone replacement.     Please let us  know if you have questions

## 2024-10-21 ENCOUNTER — Ambulatory Visit (HOSPITAL_BASED_OUTPATIENT_CLINIC_OR_DEPARTMENT_OTHER): Payer: Self-pay | Admitting: Medical

## 2025-02-14 ENCOUNTER — Ambulatory Visit (INDEPENDENT_AMBULATORY_CARE_PROVIDER_SITE_OTHER): Payer: Self-pay | Admitting: UROLOGY
